# Patient Record
Sex: Male | Born: 2000 | Race: Black or African American | Hispanic: Yes | Marital: Single | Smoking: Never smoker
Health system: Southern US, Community
[De-identification: ages and names within clinical notes are randomized; demographics above are authoritative.]

## PROBLEM LIST (undated history)

## (undated) DIAGNOSIS — F32A Depression, unspecified: Secondary | ICD-10-CM

## (undated) DIAGNOSIS — R4701 Aphasia: Secondary | ICD-10-CM

## (undated) DIAGNOSIS — F840 Autistic disorder: Secondary | ICD-10-CM

## (undated) DIAGNOSIS — R569 Unspecified convulsions: Secondary | ICD-10-CM

## (undated) DIAGNOSIS — F419 Anxiety disorder, unspecified: Secondary | ICD-10-CM

## (undated) DIAGNOSIS — H6690 Otitis media, unspecified, unspecified ear: Secondary | ICD-10-CM

## (undated) DIAGNOSIS — F329 Major depressive disorder, single episode, unspecified: Secondary | ICD-10-CM

## (undated) HISTORY — DX: Depression, unspecified: F32.A

## (undated) HISTORY — DX: Aphasia: R47.01

## (undated) HISTORY — DX: Anxiety disorder, unspecified: F41.9

## (undated) HISTORY — DX: Unspecified convulsions: R56.9

## (undated) HISTORY — DX: Major depressive disorder, single episode, unspecified: F32.9

---
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
## 2018-01-04 ENCOUNTER — Other Ambulatory Visit: Payer: Self-pay

## 2018-01-04 ENCOUNTER — Emergency Department (HOSPITAL_COMMUNITY)
Admission: EM | Admit: 2018-01-04 | Discharge: 2018-01-05 | Disposition: A | Discharge status: Home / Self Care | Payer: Medicaid Other | Attending: Emergency Medicine | Admitting: Emergency Medicine

## 2018-01-04 DIAGNOSIS — Z79899 Other long term (current) drug therapy: Secondary | ICD-10-CM | POA: Diagnosis not present

## 2018-01-04 DIAGNOSIS — F329 Major depressive disorder, single episode, unspecified: Secondary | ICD-10-CM | POA: Diagnosis not present

## 2018-01-04 DIAGNOSIS — F840 Autistic disorder: Secondary | ICD-10-CM | POA: Insufficient documentation

## 2018-01-04 DIAGNOSIS — F6381 Intermittent explosive disorder: Secondary | ICD-10-CM | POA: Insufficient documentation

## 2018-01-04 DIAGNOSIS — F419 Anxiety disorder, unspecified: Secondary | ICD-10-CM | POA: Insufficient documentation

## 2018-01-04 DIAGNOSIS — R4689 Other symptoms and signs involving appearance and behavior: Secondary | ICD-10-CM | POA: Diagnosis present

## 2018-01-04 DIAGNOSIS — F72 Severe intellectual disabilities: Secondary | ICD-10-CM | POA: Insufficient documentation

## 2018-01-04 DIAGNOSIS — R456 Violent behavior: Secondary | ICD-10-CM | POA: Diagnosis present

## 2018-01-04 LAB — COMPREHENSIVE METABOLIC PANEL
ALBUMIN: 5 g/dL (ref 3.5–5.0)
ALT: 15 U/L (ref 0–44)
AST: 22 U/L (ref 15–41)
Alkaline Phosphatase: 94 U/L (ref 38–126)
Anion gap: 9 (ref 5–15)
BUN: 9 mg/dL (ref 6–20)
CO2: 30 mmol/L (ref 22–32)
Calcium: 10 mg/dL (ref 8.9–10.3)
Chloride: 101 mmol/L (ref 98–111)
Creatinine, Ser: 0.75 mg/dL (ref 0.61–1.24)
GFR calc Af Amer: >60 mL/min (ref >60)
GFR calc non Af Amer: >60 mL/min (ref >60)
Glucose, Bld: 89 mg/dL (ref 70–99)
Potassium: 3.8 mmol/L (ref 3.5–5.1)
Sodium: 140 mmol/L (ref 135–145)
Total Bilirubin: 0.6 mg/dL (ref 0.3–1.2)
Total Protein: 7.7 g/dL (ref 6.5–8.1)

## 2018-01-04 LAB — URINALYSIS, ROUTINE W REFLEX MICROSCOPIC
Bilirubin Urine: NEGATIVE
Glucose, UA: NEGATIVE mg/dL
Hgb urine dipstick: NEGATIVE
Ketones, ur: NEGATIVE mg/dL
Leukocytes, UA: NEGATIVE
Nitrite: NEGATIVE
Protein, ur: NEGATIVE mg/dL
Specific Gravity, Urine: 1.008 (ref 1.005–1.030)
pH: 6 (ref 5.0–8.0)

## 2018-01-04 LAB — CBC
HCT: 46.9 % (ref 39.0–52.0)
HEMOGLOBIN: 15.4 g/dL (ref 13.0–17.0)
MCH: 30.3 pg (ref 26.0–34.0)
MCHC: 32.8 g/dL (ref 30.0–36.0)
MCV: 92.1 fL (ref 80.0–100.0)
Platelets: 266 10*3/uL (ref 150–400)
RBC: 5.09 MIL/uL (ref 4.22–5.81)
RDW: 12.6 % (ref 11.5–15.5)
WBC: 11 10*3/uL — ABNORMAL HIGH (ref 4.0–10.5)
nRBC: 0 % (ref 0.0–0.2)

## 2018-01-04 LAB — RAPID URINE DRUG SCREEN, HOSP PERFORMED
AMPHETAMINES: NOT DETECTED
Barbiturates: NOT DETECTED
Benzodiazepines: NOT DETECTED
Cocaine: NOT DETECTED
Opiates: NOT DETECTED
Tetrahydrocannabinol: NOT DETECTED

## 2018-01-04 LAB — TSH: TSH: 1.628 u[IU]/mL (ref 0.350–4.500)

## 2018-01-04 MED ORDER — CLONIDINE HCL 0.1 MG PO TABS
0.2000 mg | ORAL_TABLET | Freq: Two times a day (BID) | ORAL | Status: DC
  Administered 2018-01-04 – 2018-01-05 (×2): 0.2 mg via ORAL
  Filled 2018-01-04 (×2): qty 2

## 2018-01-04 MED ORDER — CYPROHEPTADINE HCL 4 MG PO TABS
4.0000 mg | ORAL_TABLET | Freq: Two times a day (BID) | ORAL | Status: DC
  Administered 2018-01-04 – 2018-01-05 (×2): 4 mg via ORAL
  Filled 2018-01-04 (×3): qty 1

## 2018-01-04 MED ORDER — MELATONIN 5 MG PO TABS
10.0000 mg | ORAL_TABLET | Freq: Every day | ORAL | Status: DC
  Administered 2018-01-04: 10 mg via ORAL
  Filled 2018-01-04 (×2): qty 2

## 2018-01-04 MED ORDER — ARIPIPRAZOLE 5 MG PO TABS
5.0000 mg | ORAL_TABLET | ORAL | Status: DC
  Administered 2018-01-05: 5 mg via ORAL
  Filled 2018-01-04: qty 1

## 2018-01-04 MED ORDER — LAMOTRIGINE 25 MG PO TABS
25.0000 mg | ORAL_TABLET | Freq: Two times a day (BID) | ORAL | Status: DC
  Administered 2018-01-04 – 2018-01-05 (×2): 25 mg via ORAL
  Filled 2018-01-04 (×2): qty 1

## 2018-01-04 MED ORDER — MIRTAZAPINE 30 MG PO TABS
30.0000 mg | ORAL_TABLET | Freq: Every day | ORAL | Status: DC
  Administered 2018-01-04: 30 mg via ORAL
  Filled 2018-01-04: qty 1

## 2018-01-04 MED ORDER — ATOMOXETINE HCL 60 MG PO CAPS
60.0000 mg | ORAL_CAPSULE | Freq: Every day | ORAL | Status: DC
  Administered 2018-01-05: 60 mg via ORAL
  Filled 2018-01-04: qty 1

## 2018-01-04 MED ORDER — HYDROXYZINE HCL 25 MG PO TABS
25.0000 mg | ORAL_TABLET | ORAL | Status: DC
  Filled 2018-01-04: qty 1

## 2018-01-04 NOTE — Progress Notes (Signed)
Nurse, Reita ClicheBobby informed of pt disposition. Dr. Donnald GarrePfeiffer informed of pt disposition.
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
## 2018-01-04 NOTE — Progress Notes (Signed)
01/04/18 1820  Notified Dr Donnald GarrePfeiffer That mom was concerned that he has not had his medicines and his seizure medicine was due at 1800. Per MD she is on her way to see patient.
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
## 2018-01-05 DIAGNOSIS — F840 Autistic disorder: Secondary | ICD-10-CM

## 2018-01-05 NOTE — ED Notes (Signed)
RN gave patient his Abilify and pt put it in his mouth and pretended to swallow with water and then went to the bathroom and spit out the medication in the toilet.

## 2018-01-05 NOTE — Progress Notes (Addendum)
CSW aware patient has been psychiatrically and medically cleared for discharge. CSW aware patient is from home and has been having increased behaviors. CSW aware patient is active with [facility]. CSW attempted to reach out to patient's legal guardian/mother, Trilby DrummerJennifer Sievert [phone], regarding patient disposition. Phone went straight to voicemail, CSW left message for return call.  12:12pm- CSW received phone call from McCombJamie with [facility] [phone]. Per Asher MuirJamie, she is patient's Care Coordinator. Asher MuirJamie confirmed that because patient is not receiving any services/funding at this time that there are no placement options for patient. Per Asher MuirJamie, patient's mother is likely at work and unable to answer her phone. Per Asher MuirJamie, patient's mother gets off around 3:30 or 4 and would be able to discuss disposition.  1:36pm- CSW received return call from patient's mother/legal guardian Victorino DikeJennifer. Per Victorino DikeJennifer, she is currently at work. CSW updated Victorino DikeJennifer that patient was assessed and the psychiatry team has psychiatrically cleared patient for discharge. Victorino DikeJennifer expressed concern for patient's behaviors and how to manage them at home. CSW explained psychiatry team is advising that patient follow up with his outpatient provider for medication management. Victorino DikeJennifer expressed understanding. Per Victorino DikeJennifer, she would be by to pick up patient at 4pm. CSW has updated patient's RN.   Archie BalboaMackenzie Irwin, LCSWA  Clinical Social Work Department  [facility]  [phone]

## 2018-01-05 NOTE — BH Assessment (Addendum)
[facility]  Per Juanetta BeetsJacqueline Norman, DO, this pt does not require psychiatric hospitalization at this time.  Pt presents under IVC initiated by pt's mother/legal guardian, which Dr Sharma CovertNorman has rescinded.  Pt is psychiatrically cleared.  Discharge instructions advise pt to continue treatment with Thedore MinsMojeed Akintayo, MD at the [facility].  Archie BalboaMackenzie Irwin, CSW agrees to contact pt's guardian.  Pt's nurse, Waldo LaineKaitlin, has been notified.  Doylene Canninghomas Harvy Riera, MA Triage Specialist [phone]

## 2018-01-05 NOTE — ED Notes (Signed)
Patient attempting to pull away from mom and run out the door after RN finished going over discharge paperwork.  Mother spoke firmly to patient and he pulled her sharply.  Pt walked out with staff

## 2018-01-05 NOTE — ED Notes (Signed)
Patient continually pulling off clothing and ripping paper clothes. Pt currently shirtless. RN unable to make patient stop ripping his clothes

## 2018-01-05 NOTE — Consult Note (Addendum)
[name] Psych ED Discharge  01/05/2018 11:40 AM Nicholas Shepherd  MRN:  [id number] Principal Problem: Autism spectrum disorder Discharge Diagnoses:  Patient Active Problem List   Diagnosis Date Noted  . DMDD (disruptive mood dysregulation disorder) (HCC) [F34.81] 11/06/2017  . Autism spectrum disorder [F84.0] 11/06/2017  . Intellectual disability [F79] 11/06/2017  . Aggressive behavior [R46.89] 06/09/2017    Subjective:17 year old male who presented to the ED under IVC by mother for increased obsessive behaviors, aggression and violence towards staff and [facility]. Patient is nonverbal and has severe autism, unable to assess patient.  Mother no longer feels safe to take patient home, and is requesting assistance with placement. She reports patient is unable to identify any triggers at this time that could lead to his increase in aggressive behaviors. No attempts to hurt himself or others, not responding to internal stimuli, no substance abuse.  Stable to return home.  Total Time spent with patient: 20 minutes  Past Psychiatric History: autism, DMDD, ID, Autism Spectrum Disorder  Past Medical History:  Past Medical History:  Diagnosis Date  . Anxiety   . Autism disorder    severe per legal gaurdian  . Depression   . Nonverbal   . Otitis media   . Seizures (HCC) 11/09/2017   No past surgical history on file. Family History:  Family History  Problem Relation Age of Onset  . Seizures Mother        epilepsy as a child   Family Psychiatric  History: none per chart review.  Social History:  Social History   Substance and Sexual Activity  Alcohol Use No     Social History   Substance and Sexual Activity  Drug Use No    Social History   Socioeconomic History  . Marital status: Single    Spouse name: Not on file  . Number of children: 0  . Years of education: 7  . Highest education level: Not on file  Occupational History    Comment: NA  Social Needs  . Financial resource  strain: Not on file  . Food insecurity:    Worry: Not on file    Inability: Not on file  . Transportation needs:    Medical: Not on file    Non-medical: Not on file  Tobacco Use  . Smoking status: Never Smoker  . Smokeless tobacco: Never Used  Substance and Sexual Activity  . Alcohol use: No  . Drug use: No  . Sexual activity: Not on file  Lifestyle  . Physical activity:    Days per week: Not on file    Minutes per session: Not on file  . Stress: Not on file  Relationships  . Social connections:    Talks on phone: Not on file    Gets together: Not on file    Attends religious service: Not on file    Active member of club or organization: Not on file    Attends meetings of clubs or organizations: Not on file    Relationship status: Not on file  Other Topics Concern  . Not on file  Social History Narrative   Lives with mother, legal guardian   No caffeine    Has this patient used any form of tobacco in the last 30 days? (Cigarettes, Smokeless Tobacco, Cigars, and/or Pipes) He does not use tobacco products.   Current Medications: Current Facility-Administered Medications  Medication Dose Route Frequency Provider Last Rate Last Dose  . ARIPiprazole (ABILIFY) tablet 5 mg  5 mg Oral Ardelia Mems, MD   5 mg at 01/05/18 0735  . atomoxetine (STRATTERA) capsule 60 mg  60 mg Oral Daily Arby Barrette, MD   60 mg at 01/05/18 0943  . cloNIDine (CATAPRES) tablet 0.2 mg  0.2 mg Oral BID Arby Barrette, MD   0.2 mg at 01/05/18 0942  . cyproheptadine (PERIACTIN) 4 MG tablet 4 mg  4 mg Oral BID Arby Barrette, MD   4 mg at 01/05/18 0943  . hydrOXYzine (ATARAX/VISTARIL) tablet 25 mg  25 mg Oral See admin instructions Arby Barrette, MD      . lamoTRIgine (LAMICTAL) tablet 25 mg  25 mg Oral BID Arby Barrette, MD   25 mg at 01/05/18 0943  . Melatonin TABS 10 mg  10 mg Oral QHS Arby Barrette, MD   10 mg at 01/04/18 2134  . mirtazapine (REMERON) tablet 30 mg  30 mg Oral QHS  Arby Barrette, MD   30 mg at 01/04/18 2133   Current Outpatient Medications  Medication Sig Dispense Refill  . ARIPiprazole (ABILIFY) 5 MG tablet Take 5 mg by mouth every morning.     Marland Kitchen atomoxetine (STRATTERA) 60 MG capsule Take 60 mg by mouth daily.    . cloNIDine (CATAPRES) 0.2 MG tablet Take 0.2 mg by mouth 2 (two) times daily.   1  . cyproheptadine (PERIACTIN) 4 MG tablet Take 1 tablet (4 mg total) by mouth 2 (two) times daily. 60 tablet 0  . hydrOXYzine (ATARAX/VISTARIL) 25 MG tablet Take 25 mg by mouth See admin instructions. Take 25 mg by mouth three times a day- 6 AM, 11 AM, and 6 PM  1  . Iron-Vitamins (GERITOL PO) Take 1 tablet by mouth daily.    Marland Kitchen lamoTRIgine (LAMICTAL) 25 MG tablet Take 25mg  daily for 2 weeks; then take 25mg  BID for 2 weeks; then take 50mg  BID. (Patient taking differently: Take 25 mg by mouth 2 (two) times daily. ) 120 tablet 12  . MELATONIN PO Take 10 mg by mouth at bedtime.     . mirtazapine (REMERON) 30 MG tablet Take 1 tablet (30 mg total) by mouth at bedtime. 30 tablet 0   PTA Medications: (Not in a hospital admission)   Musculoskeletal: Strength & Muscle Tone: within normal limits Gait & Station: normal Patient leans: N/A  Psychiatric Specialty Exam: Physical Exam  Nursing note and vitals reviewed. Constitutional: He appears well-developed and well-nourished.  HENT:  Head: Normocephalic and atraumatic.  Neck: Normal range of motion.  Respiratory: Effort normal.  Musculoskeletal: Normal range of motion.  Neurological: He is alert.  Psychiatric: His behavior is normal. Thought content normal. His affect is blunt. Cognition and memory are impaired. He expresses impulsivity. He is noncommunicative.    Review of Systems  Unable to perform ROS: Patient nonverbal    Blood pressure (!) 130/98, pulse 97, temperature 97.6 F (36.4 C), temperature source Oral, resp. rate 16, SpO2 99 %.There is no height or weight on file to calculate BMI.  General  Appearance: Casual, topless, ripping paper scrubs  Eye Contact:  Good  Speech:  limited  Volume:  Patient is nonverbal.  Mood:  Euthymic  Affect:  Blunt  Thought Process:  Unable to assess, patient is nonverbal.   Orientation:  Other:  person  Thought Content:  unable to assess, limited communication abilities  Suicidal Thoughts: Unable to assess, patient is nonverbal.   Homicidal Thoughts:  Unable to assess, patient is nonverbal.   Memory:  unable to assess,  limited communication  Judgement:  Impaired  Insight:  Lacking  Psychomotor Activity:  Normal  Concentration: Unable to assess, patient is nonverbal.   Recall:  Unable to assess, patient is nonverbal.   Fund of Knowledge:  Unable to assess, patient is nonverbal.   Language:  Unable to assess, patient is nonverbal.   Akathisia:  No  Handed:  Right  AIMS (if indicated):   N/A  Assets:  Housing Leisure Time Physical Health Resilience Social Support  ADL's:  Impaired  Cognition:  Impaired,  Severe  Sleep:   N/A     Demographic Factors:  Male and Adolescent or young adult  Loss Factors: NA  Historical Factors: Impulsivity  Risk Reduction Factors:   Sense of responsibility to family, Living with another person, especially a relative, Positive social support and Positive therapeutic relationship  Continued Clinical Symptoms:  None  Cognitive Features That Contribute To Risk:  None    Suicide Risk:  Minimal: No identifiable suicidal ideation.  Patients presenting with no risk factors but with morbid ruminations; may be classified as minimal risk based on the severity of the depressive symptoms    Plan Of Care/Follow-up recommendations:  Activity:  as tolerated Diet:  heart healthy diet Other:  Patient to be psych cleared at this time. He continues to not meet criteria for inpatient admissions. Current behaviors are consistent with Autism Disorder. CSW is working closely with Care Coordinator to find funding and  placement for patient at this time.  Disposition: Discharge home Maryagnes Amosakia S Starkes-Perry, FNP 01/05/2018, 11:40 AM    Patient seen face-to-face for psychiatric evaluation, chart reviewed and case discussed with the physician extender and developed treatment plan. Reviewed the information documented and agree with the treatment plan.  Juanetta BeetsJacqueline Brannan Cassedy, DO 01/05/18 4:31 PM

## 2018-01-05 NOTE — Discharge Instructions (Signed)
For your behavioral health needs, you are advised to continue treatment with Mojeed Akintayo, MD, at the [facility]: ° °     [facility] °     [address] °     [place], [place] [address] °     [phone]

## 2018-01-05 NOTE — ED Notes (Signed)
Patient swallowed all 4 medications.

## 2018-01-05 NOTE — BHH Counselor (Signed)
12.18.19- Per Dr. Norman and Takia Starkes, PMHNP patient is psych cleared for discharge.  

## 2018-01-05 NOTE — ED Notes (Signed)
Patient attempting to run into nurses station. Security at bedside

## 2018-03-17 ENCOUNTER — Emergency Department (HOSPITAL_COMMUNITY)
Admission: EM | Admit: 2018-03-17 | Discharge: 2018-03-17 | Disposition: A | Discharge status: Home / Self Care | Payer: Medicaid Other | Attending: Emergency Medicine | Admitting: Emergency Medicine

## 2018-03-17 ENCOUNTER — Other Ambulatory Visit: Payer: Self-pay

## 2018-03-17 DIAGNOSIS — R569 Unspecified convulsions: Secondary | ICD-10-CM | POA: Diagnosis present

## 2018-03-17 DIAGNOSIS — Z79899 Other long term (current) drug therapy: Secondary | ICD-10-CM | POA: Diagnosis not present

## 2018-03-17 DIAGNOSIS — F840 Autistic disorder: Secondary | ICD-10-CM | POA: Diagnosis not present

## 2018-03-17 LAB — BASIC METABOLIC PANEL
Anion gap: 10 (ref 5–15)
BUN: 10 mg/dL (ref 6–20)
CALCIUM: 9.8 mg/dL (ref 8.9–10.3)
CO2: 26 mmol/L (ref 22–32)
CREATININE: 0.83 mg/dL (ref 0.61–1.24)
Chloride: 104 mmol/L (ref 98–111)
GFR calc Af Amer: >60 mL/min (ref >60)
GFR calc non Af Amer: >60 mL/min (ref >60)
Glucose, Bld: 75 mg/dL (ref 70–99)
Potassium: 4.1 mmol/L (ref 3.5–5.1)
Sodium: 140 mmol/L (ref 135–145)

## 2018-03-17 LAB — CBC WITH DIFFERENTIAL/PLATELET
Abs Immature Granulocytes: 0.23 10*3/uL — ABNORMAL HIGH (ref 0.00–0.07)
Basophils Absolute: 0 10*3/uL (ref 0.0–0.1)
Basophils Relative: 0 %
EOS ABS: 0.2 10*3/uL (ref 0.0–0.5)
Eosinophils Relative: 2 %
HCT: 45.8 % (ref 39.0–52.0)
Hemoglobin: 15.1 g/dL (ref 13.0–17.0)
Immature Granulocytes: 2 %
Lymphocytes Relative: 12 %
Lymphs Abs: 1.5 10*3/uL (ref 0.7–4.0)
MCH: 29.5 pg (ref 26.0–34.0)
MCHC: 33 g/dL (ref 30.0–36.0)
MCV: 89.5 fL (ref 80.0–100.0)
MONO ABS: 0.6 10*3/uL (ref 0.1–1.0)
Monocytes Relative: 5 %
Neutro Abs: 9.9 10*3/uL — ABNORMAL HIGH (ref 1.7–7.7)
Neutrophils Relative %: 79 %
Platelets: 284 10*3/uL (ref 150–400)
RBC: 5.12 MIL/uL (ref 4.22–5.81)
RDW: 12.4 % (ref 11.5–15.5)
WBC: 12.4 10*3/uL — ABNORMAL HIGH (ref 4.0–10.5)
nRBC: 0 % (ref 0.0–0.2)

## 2018-03-17 LAB — ETHANOL

## 2018-03-17 LAB — ACETAMINOPHEN LEVEL: Acetaminophen (Tylenol), Serum: <10 ug/mL — ABNORMAL LOW (ref 10–30)

## 2018-03-17 LAB — CBG MONITORING, ED: Glucose-Capillary: 92 mg/dL (ref 70–99)

## 2018-03-17 LAB — SALICYLATE LEVEL: Salicylate Lvl: <7 mg/dL (ref 2.8–30.0)

## 2018-03-17 NOTE — Discharge Instructions (Addendum)
Please follow up with neurology tomorrow as scheduled for further evaluation of your seizure.  Return to the ER if you develop another seizure episode or if you have any concerns.

## 2018-03-17 NOTE — ED Notes (Signed)
Family at bedside. 

## 2018-03-17 NOTE — ED Triage Notes (Signed)
Pt BIBA from school c/o witnessed seizure.  School staff report that pt was standing upright when he had a "full body seizure and they went to catch him before he fell and hit his head." Staff timed seizure - 4 min 22 sec.   Pt had incontinent episode, no trauma seen to tongue.   Pt was post-ictal when EMS arrived, able to respond to name.

## 2018-03-17 NOTE — ED Provider Notes (Signed)
[facility]-EMERGENCY DEPT Provider Note   CSN: [id number] Arrival date & time: 03/17/18  1356  History   Chief Complaint Chief Complaint  Patient presents with  . Seizures    HPI Nicholas Shepherd is a 18 y.o. male with past medical past medical history significant for anxiety, autism disorder, nonverbal at baseline seizure disorder, DMDD who presents for seizure.  Patient had a witnessed seizure at school.  Patient had generalized tonic-clonic seizure.  Staff was able to catch patient before he fell.  Staff timed seizure at 4 minutes 22 seconds.  Patient had episode of incontinence with seizure.  No tongue trauma.  Patient was postictal when EMS arrival, was able to respond to name.  Level 5 caveat, nonverbal at baseline and postictal.  Patient patient mother in room.  States patient has been acting his "normal self."  No recent fever, cough, headaches, abdominal pain, normal urination and bowel movements.  No recent trauma or injuries.     HPI  Past Medical History:  Diagnosis Date  . Anxiety   . Autism disorder    severe per legal gaurdian  . Depression   . Nonverbal   . Otitis media   . Seizures (HCC) 11/09/2017    Patient Active Problem List   Diagnosis Date Noted  . DMDD (disruptive mood dysregulation disorder) (HCC) 11/06/2017  . Autism spectrum disorder 11/06/2017  . Intellectual disability 11/06/2017  . Aggressive behavior 06/09/2017    No past surgical history on file.      Home Medications    Prior to Admission medications   Medication Sig Start Date End Date Taking? Authorizing Provider  ARIPiprazole (ABILIFY) 5 MG tablet Take 5 mg by mouth every morning.    Yes [provider]  atomoxetine (STRATTERA) 60 MG capsule Take 60 mg by mouth daily.   Yes [provider]  clomiPRAMINE (ANAFRANIL) 50 MG capsule Take 50 mg by mouth every 12 (twelve) hours. 02/28/18  Yes [provider]  cloNIDine (CATAPRES) 0.2 MG  tablet Take 0.2 mg by mouth 2 (two) times daily.  05/20/17  Yes [provider]  cyproheptadine (PERIACTIN) 4 MG tablet Take 1 tablet (4 mg total) by mouth 2 (two) times daily. 11/07/17  Yes Charm Rings, NP  hydrOXYzine (ATARAX/VISTARIL) 50 MG tablet Take 50 mg by mouth 3 (three) times daily. 02/28/18  Yes [provider]  Iron-Vitamins (GERITOL PO) Take 1 tablet by mouth daily.   Yes [provider]  lamoTRIgine (LAMICTAL) 25 MG tablet Take 25mg  daily for 2 weeks; then take 25mg  BID for 2 weeks; then take 50mg  BID. Patient taking differently: Take 25 mg by mouth 2 (two) times daily.  11/11/17  Yes Penumalli, Vikram R, MD  MELATONIN PO Take 10 mg by mouth at bedtime.    Yes [provider]  mirtazapine (REMERON) 30 MG tablet Take 1 tablet (30 mg total) by mouth at bedtime. Patient not taking: Reported on 03/17/2018 11/07/17   Charm Rings, NP    Family History Family History  Problem Relation Age of Onset  . Seizures Mother        epilepsy as a child    Social History Social History   Tobacco Use  . Smoking status: Never Smoker  . Smokeless tobacco: Never Used  Substance Use Topics  . Alcohol use: No  . Drug use: No     Allergies   Depakote [divalproex sodium]   Review of Systems Review of Systems  Unable to perform ROS: Patient nonverbal     Physical Exam Updated Vital Signs BP 99/61   Pulse 80   Temp 97.9 F (36.6 C) (Oral)   Resp 18   SpO2 100%   Physical Exam Vitals signs and nursing note reviewed.  Constitutional:      General: He is not in acute distress.    Appearance: He is well-developed. He is not ill-appearing, toxic-appearing or diaphoretic.     Comments: Somnolent, Postictal  HENT:     Head: Atraumatic.     Comments: Head atraumatic. No contusions, abrasions or hematomas.  No periorbital erythema. Eyes:     Pupils: Pupils are equal, round, and reactive to light.  Neck:     Musculoskeletal: Normal range of  motion and neck supple.     Comments: No neck stiffness or neck rigidity. No meningismus. Cardiovascular:     Rate and Rhythm: Normal rate and regular rhythm.     Pulses: Normal pulses.     Heart sounds: Normal heart sounds.  Pulmonary:     Effort: Pulmonary effort is normal. No respiratory distress.     Breath sounds: Normal breath sounds. No stridor. No wheezing, rhonchi or rales.  Abdominal:     General: There is no distension.     Palpations: Abdomen is soft.     Comments: Soft without rebound or guarding.  Musculoskeletal: Normal range of motion.     Comments: Moves all 4 extremities without difficulty. Able to follow small commands.  Skin:    General: Skin is warm and dry.     Comments: No rashes or lesions.  Neurological:     Mental Status: He is alert.     Comments: No facial asymmetry     ED Treatments / Results  Labs (all labs ordered are listed, but only abnormal results are displayed) Labs Reviewed  CBC WITH DIFFERENTIAL/PLATELET - Abnormal; Notable for the following components:      Result Value   WBC 12.4 (*)    All other components within normal limits  BASIC METABOLIC PANEL  SALICYLATE LEVEL  ACETAMINOPHEN LEVEL  ETHANOL  RAPID URINE DRUG SCREEN, HOSP PERFORMED  URINALYSIS, ROUTINE W REFLEX MICROSCOPIC  CBG MONITORING, ED    EKG None  Radiology No results found.  Procedures Procedures (including critical care time)  Medications Ordered in ED Medications - No data to display   Initial Impression / Assessment and Plan / ED Course  I have reviewed the triage vital signs and the nursing notes.  Pertinent labs & imaging results that were available during my care of the patient were reviewed by me and considered in my medical decision making (see chart for details).  18 year old who appears otherwise well presents for evaluation of seizure.  Afebrile, nonseptic, non-ill-appearing.  Patient postictal on arrival after 4-minute seizure.  Did not hit  head.  This is first seizure since starting on Lamictal.  Followed by [facility].  Started on Lamictal November 2019. Able to follow small commands on initial evaluation. Will obtain labs to look for secondary causes of seizure and reevaluate.  Do not think patient needs additional head CT at this time.  No recent illnesses per mother.  Labs and reassessment pending care transfer as patient is postictal.  Care transferred to [facility] [place].   Ultimate treatment, plan and disposition to be determined by Laveda Norman, PA-C.  Patient has been seen evaluated by attending Dr. Pilar Plate who agrees with the treatment, plan and disposition.  Final Clinical Impressions(s) / ED Diagnoses   Final diagnoses:  Seizure [facility])    ED Discharge Orders    None       Evelen Vazguez A, PA-C 03/17/18 1557    Sabas Sous, MD 03/17/18 [phone]

## 2018-03-17 NOTE — ED Notes (Signed)
Bed: WA20 Expected date:  Expected time:  Means of arrival:  Comments: EMS-SZ 

## 2018-03-17 NOTE — ED Provider Notes (Signed)
Received sign out at beginning of shift.  Recent hx of seizure, on Lamictal.  Had a seizure episode today, witnessed at school today. No injury.  No recent sickness.  Check labs and lamictal.  If back to baseline, can be discharge to f/u with neuro outpt.    5:17 PM Pt back to baseline, labs are reassuring.  He does have an appointment to be seen by his neurologist DR. Penumalli tomorrow.  If he develop another seizure episode then he should return for further management.  Mom at bedside agrees with plan.   BP 109/70   Pulse 88   Temp 97.9 F (36.6 C) (Oral)   Resp 17   SpO2 97%   Results for orders placed or performed during the hospital encounter of 03/17/18  Basic metabolic panel  Result Value Ref Range   Sodium 140 135 - 145 mmol/L   Potassium 4.1 3.5 - 5.1 mmol/L   Chloride 104 98 - 111 mmol/L   CO2 26 22 - 32 mmol/L   Glucose, Bld 75 70 - 99 mg/dL   BUN 10 6 - 20 mg/dL   Creatinine, Ser 2.50 0.61 - 1.24 mg/dL   Calcium 9.8 8.9 - 53.9 mg/dL   GFR calc non Af Amer >60 >60 mL/min   GFR calc Af Amer >60 >60 mL/min   Anion gap 10 5 - 15  CBC WITH DIFFERENTIAL  Result Value Ref Range   WBC 12.4 (H) 4.0 - 10.5 K/uL   RBC 5.12 4.22 - 5.81 MIL/uL   Hemoglobin 15.1 13.0 - 17.0 g/dL   HCT 76.7 34.1 - 93.7 %   MCV 89.5 80.0 - 100.0 fL   MCH 29.5 26.0 - 34.0 pg   MCHC 33.0 30.0 - 36.0 g/dL   RDW 90.2 40.9 - 73.5 %   Platelets 284 150 - 400 K/uL   nRBC 0.0 0.0 - 0.2 %   Neutrophils Relative % 79 %   Neutro Abs 9.9 (H) 1.7 - 7.7 K/uL   Lymphocytes Relative 12 %   Lymphs Abs 1.5 0.7 - 4.0 K/uL   Monocytes Relative 5 %   Monocytes Absolute 0.6 0.1 - 1.0 K/uL   Eosinophils Relative 2 %   Eosinophils Absolute 0.2 0.0 - 0.5 K/uL   Basophils Relative 0 %   Basophils Absolute 0.0 0.0 - 0.1 K/uL   WBC Morphology MORPHOLOGY UNREMARKABLE    Immature Granulocytes 2 %   Abs Immature Granulocytes 0.23 (H) 0.00 - 0.07 K/uL  Salicylate level  Result Value Ref Range   Salicylate Lvl  <7.0 2.8 - 30.0 mg/dL  Acetaminophen level  Result Value Ref Range   Acetaminophen (Tylenol), Serum <10 (L) 10 - 30 ug/mL  Ethanol/ETOH  Result Value Ref Range   Alcohol, Ethyl (B) <10 <10 mg/dL  CBG monitoring, ED  Result Value Ref Range   Glucose-Capillary 92 70 - 99 mg/dL   No results found.    Fayrene Helper, PA-C 03/17/18 1717    Derwood Kaplan, MD 03/17/18 [phone]

## 2018-03-18 ENCOUNTER — Other Ambulatory Visit: Payer: Self-pay

## 2018-03-18 ENCOUNTER — Ambulatory Visit (INDEPENDENT_AMBULATORY_CARE_PROVIDER_SITE_OTHER): Payer: Medicaid Other | Admitting: Family Medicine

## 2018-03-18 ENCOUNTER — Encounter: Payer: Self-pay | Admitting: Family Medicine

## 2018-03-18 VITALS — BP 126/74 | HR 80 | Resp 14 | Ht 68.5 in | Wt 126.5 lb

## 2018-03-18 DIAGNOSIS — R569 Unspecified convulsions: Secondary | ICD-10-CM | POA: Diagnosis not present

## 2018-03-18 MED ORDER — LAMOTRIGINE 25 MG PO TABS: ORAL_TABLET | ORAL | 12 refills | Status: DC

## 2018-03-18 NOTE — Patient Instructions (Addendum)
Increase lamotrigine to 50mg  twice daily  Follow up in 2 months (per mom's request)   Seizure, Adult When you have a seizure:  Parts of your body may move.  You may have a change in how aware or awake (conscious) you are.  You may shake (convulse). Seizures usually last from 30 seconds to 2 minutes. Usually, they are not harmful unless they last a long time. What are the signs or symptoms? Common symptoms of this condition include:  Shaking (convulsions).  Stiffness in the body.  Passing out (losing consciousness).  Uncontrolled movements in the: ? Arms or legs. ? Eyes. ? Head. ? Mouth. Some people have symptoms right before a seizure happens. These symptoms may include:  Fear.  Worry (anxiety).  Feeling like you are going to throw up (nausea).  Feeling like the room is spinning (vertigo).  Feeling like you saw or heard something before (dj vu).  Odd tastes or smells.  Changes in vision, such as seeing flashing lights or spots. Follow these instructions at home: Medicines   Take over-the-counter and prescription medicines only as told by your doctor.  Do not eat or drink anything that may keep your medicine from working, such as alcohol. Activity  Do not do any activities that would be dangerous if you had another seizure, like driving or swimming. Wait until your doctor says it is safe for you to do them.  If you live in the [place] ask your local DMV (department of motor vehicles) when you can drive.  Get plenty of rest. Teaching others   Teach friends and family what to do when you have a seizure. They should: ? Lay you on the ground. ? Protect your head and body. ? Loosen any tight clothing around your neck. ? Turn you on your side. ? Not hold you down. ? Not put anything into your mouth. ? Know whether or not you need emergency care. ? Stay with you until you are better. General instructions  Contact your doctor each time you have a  seizure.  Avoid anything that gives you seizures.  Keep a seizure diary. Write down: ? What you think caused each seizure. ? What you remember about each seizure.  Keep all follow-up visits as told by your doctor. This is important. Contact a doctor if:  You have another seizure.  You have seizures more often.  There is any change in what happens during your seizures.  You keep having seizures with treatment.  You have symptoms of being sick or having an infection. Get help right away if:  You have a seizure: ? That lasts longer than 5 minutes. ? That is different than seizures you had before. ? That makes it harder to breathe. ? After you hurt your head.  After a seizure, you cannot speak or use a part of your body.  After a seizure, you are confused or have a bad headache.  You have two or more seizures in a row.  You are having seizures more often.  You do not wake up right after a seizure.  You get hurt during a seizure. In an emergency:  These symptoms may be an emergency. Do not wait to see if the symptoms will go away. Get medical help right away. Call your local emergency services (911 in the [place] Do not drive yourself to the hospital. Summary  Seizures usually last from 30 seconds to 2 minutes. Usually, they are not harmful unless they last a long time.  Do not eat or drink anything that may keep your medicine from working, such as alcohol.  Teach friends and family what to do when you have a seizure.  Contact your doctor each time you have a seizure. This information is not intended to replace advice given to you by your health care provider. Make sure you discuss any questions you have with your health care provider. Document Released: 06/24/2007 Document Revised: 09/29/2017 Document Reviewed: 02/11/2017 Elsevier Interactive Patient Education  2019 [employer].    Lamotrigine tablets What is this medicine? LAMOTRIGINE (la MOE Patrecia Pace) is used  to control seizures in adults and children with epilepsy and Lennox-Gastaut syndrome. It is also used in adults to treat bipolar disorder. This medicine may be used for other purposes; ask your health care provider or pharmacist if you have questions. COMMON BRAND NAME(S): Lamictal, Subvenite What should I tell my health care provider before I take this medicine? They need to know if you have any of these conditions: -aseptic meningitis during prior use of lamotrigine -depression -folate deficiency -kidney disease -liver disease -suicidal thoughts, plans, or attempt; a previous suicide attempt by you or a family member -an unusual or allergic reaction to lamotrigine or other seizure medications, other medicines, foods, dyes, or preservatives -pregnant or trying to get pregnant -breast-feeding How should I use this medicine? Take this medicine by mouth with a glass of water. Follow the directions on the prescription label. Do not chew these tablets. If this medicine upsets your stomach, take it with food or milk. Take your doses at regular intervals. Do not take your medicine more often than directed. A special MedGuide will be given to you by the pharmacist with each new prescription and refill. Be sure to read this information carefully each time. Talk to your pediatrician regarding the use of this medicine in children. While this drug may be prescribed for children as young as 2 years for selected conditions, precautions do apply. Overdosage: If you think you have taken too much of this medicine contact a poison control center or emergency room at once. NOTE: This medicine is only for you. Do not share this medicine with others. What if I miss a dose? If you miss a dose, take it as soon as you can. If it is almost time for your next dose, take only that dose. Do not take double or extra doses. What may interact with this medicine? -atazanavir -carbamazepine -male hormones, including  contraceptive or birth control pills -lopinavir -methotrexate -phenobarbital -phenytoin -primidone -pyrimethamine -rifampin -ritonavir -trimethoprim -valproic acid This list may not describe all possible interactions. Give your health care provider a list of all the medicines, herbs, non-prescription drugs, or dietary supplements you use. Also tell them if you smoke, drink alcohol, or use illegal drugs. Some items may interact with your medicine. What should I watch for while using this medicine? Visit your doctor or health care professional for regular checks on your progress. If you take this medicine for seizures, wear a Medic Alert bracelet or necklace. Carry an identification card with information about your condition, medicines, and doctor or health care professional. It is important to take this medicine exactly as directed. When first starting treatment, your dose will need to be adjusted slowly. It may take weeks or months before your dose is stable. You should contact your doctor or health care professional if your seizures get worse or if you have any new types of seizures. Do not stop taking this medicine unless  instructed by your doctor or health care professional. Stopping your medicine suddenly can increase your seizures or their severity. Contact your doctor or health care professional right away if you develop a rash while taking this medicine. Rashes may be very severe and sometimes require treatment in the hospital. Deaths from rashes have occurred. Serious rashes occur more often in children than adults taking this medicine. It is more common for these serious rashes to occur during the first 2 months of treatment, but a rash can occur at any time. You may get drowsy, dizzy, or have blurred vision. Do not drive, use machinery, or do anything that needs mental alertness until you know how this medicine affects you. To reduce dizzy or fainting spells, do not sit or stand up quickly,  especially if you are an older patient. Alcohol can increase drowsiness and dizziness. Avoid alcoholic drinks. If you are taking this medicine for bipolar disorder, it is important to report any changes in your mood to your doctor or health care professional. If your condition gets worse, you get mentally depressed, feel very hyperactive or manic, have difficulty sleeping, or have thoughts of hurting yourself or committing suicide, you need to get help from your health care professional right away. If you are a caregiver for someone taking this medicine for bipolar disorder, you should also report these behavioral changes right away. The use of this medicine may increase the chance of suicidal thoughts or actions. Pay special attention to how you are responding while on this medicine. Your mouth may get dry. Chewing sugarless gum or sucking hard candy, and drinking plenty of water may help. Contact your doctor if the problem does not go away or is severe. Women who become pregnant while using this medicine may enroll in the [place] American Antiepileptic Drug Pregnancy Registry by calling [id number]. This registry collects information about the safety of antiepileptic drug use during pregnancy. This medicine may cause a decrease in folic acid. You should make sure that you get enough folic acid while you are taking this medicine. Discuss the foods you eat and the vitamins you take with your health care professional. What side effects may I notice from receiving this medicine? Side effects that you should report to your doctor or health care professional as soon as possible: -allergic reactions like skin rash, itching or hives, swelling of the face, lips, or tongue -changes in vision -depressed mood -elevated mood, decreased need for sleep, racing thoughts, impulsive behavior -fever with rash, swollen lymph nodes, or swelling of the face -loss of balance or coordination -mouth sores -redness,  blistering, peeling or loosening of the skin, including inside the mouth -right upper belly pain -seizures -severe muscle pain -signs and symptoms of aseptic meningitis such as stiff neck and sensitivity to light, headache, drowsiness, fever, nausea, vomiting, rash -signs of infection - fever or chills, cough, sore throat, pain or difficulty passing urine -suicidal thoughts or other mood changes -swollen lymph nodes -trouble walking -unusual bruising or bleeding -unusually weak or tired -yellowing of the eyes or skin Side effects that usually do not require medical attention (report to your doctor or health care professional if they continue or are bothersome): -diarrhea -dizziness -dry mouth -stuffy nose -tiredness -tremors -trouble sleeping This list may not describe all possible side effects. Call your doctor for medical advice about side effects. You may report side effects to FDA at 1-800-FDA-1088. Where should I keep my medicine? Keep out of reach of children. Store at room temperature  between 15 and 30 degrees C (59 and 86 degrees F). Throw away any unused medicine after the expiration date. NOTE: This sheet is a summary. It may not cover all possible information. If you have questions about this medicine, talk to your doctor, pharmacist, or health care provider.  2019 Elsevier/Gold Standard (2016-08-24 16:07:39)

## 2018-03-18 NOTE — Progress Notes (Signed)
I reviewed note and agree with plan.   Suanne Marker, MD 03/18/2018, 1:44 PM Certified in Neurology, Neurophysiology and Neuroimaging  [facility] [address] [place], [place] [address] [phone]

## 2018-03-18 NOTE — Progress Notes (Signed)
PATIENT: Nicholas Shepherd DOB: April 11, 2000  REASON FOR VISIT: follow up HISTORY FROM: patient  Chief Complaint  Patient presents with  . Seizures    Here with mother who sts. pt. is compliant with Lamictal, no missed doses. Had a sz. at school yesterday, seen in the ER, no changes to meds./fim     HISTORY OF PRESENT ILLNESS: Today 03/18/18 Nicholas Shepherd is a 18 y.o. male here today for follow up of seizures.  He has taken lamotrigine 25 mg twice daily since last being seen in October 2019.  Mom reports being unaware of suggested dose of 50 mg twice daily.  Walter has tolerated medication well with no obvious adverse effects.  Unfortunately he did have a seizure yesterday at school.  This was a witnessed tonic-clonic event.  Seizure lasted a little over 4 minutes.  He was incontinent during event.  No tongue trauma noted.  His teacher was able to catch him prior to his fall.  They do not think he hit his head.  Mom reports that he was quite confused for short period of time following the seizure.  He was evaluated in the ER and discharged with follow-up here.  Work-up was unremarkable.  White blood cell count 12.4.  Mom denies any concerns of sickness.  No fever or URI symptoms noted.  He has not missed any doses of medication.  This is the only seizure activity she has noted since his last visit.  He is nonverbal at baseline.  He is followed very closely by psychiatry for autism.  He does not drive.   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  For past few months patient has been having increasing intermittent behavioral outbursts, managed by psychiatry Dr. Jannifer Franklin.   Patient was at school when he had a seizure and fell out of the chair and hit his head, bit his tongue.  Seizure lasted approximately 4 minutes.  Patient was taken to the hospital for evaluation.  CT of the head was unremarkable.   Lab testing could not be done as patient was uncooperative.  Patient was discharged for follow-up in neurology clinic.  No similar events in the past.  No prior history of seizures.  No family history of seizure.  REVIEW OF SYSTEMS: Out of a complete 14 system review of symptoms, the patient complains only of the following symptoms, chills, seizure, behavioral problem, autism and all other reviewed systems are negative.  ALLERGIES: Allergies  Allergen Reactions  . Depakote [Divalproex Sodium] Other (See Comments)    Made patient angry and his behavioral issues worsened     HOME MEDICATIONS: Outpatient Medications Prior to Visit  Medication Sig Dispense Refill  . ARIPiprazole (ABILIFY) 5 MG tablet Take 5 mg by mouth every morning.     Marland Kitchen atomoxetine (STRATTERA) 60 MG capsule Take 60 mg by mouth daily.    . clomiPRAMINE (ANAFRANIL) 50 MG capsule Take 50 mg by mouth every 12 (twelve) hours.    . cloNIDine (CATAPRES) 0.2 MG tablet Take 0.2 mg by mouth 2 (two) times daily.   1  . cyproheptadine (PERIACTIN) 4 MG tablet Take 1 tablet (4 mg total) by mouth 2 (two) times daily. 60 tablet 0  . hydrOXYzine (ATARAX/VISTARIL) 50 MG tablet Take 50 mg by mouth 3 (three) times daily.    . Iron-Vitamins (GERITOL PO) Take 1 tablet by mouth daily.    Marland Kitchen MELATONIN PO  Take 10 mg by mouth at bedtime.     . lamoTRIgine (LAMICTAL) 25 MG tablet Take 25mg  daily for 2 weeks; then take 25mg  BID for 2 weeks; then take 50mg  BID. (Patient taking differently: Take 25 mg by mouth 2 (two) times daily. ) 120 tablet 12  . mirtazapine (REMERON) 30 MG tablet Take 1 tablet (30 mg total) by mouth at bedtime. (Patient not taking: Reported on 03/18/2018) 30 tablet 0   No facility-administered medications prior to visit.     PAST MEDICAL HISTORY: Past Medical History:  Diagnosis Date  . Anxiety   . Autism disorder    severe per legal gaurdian  . Depression   . Nonverbal   . Otitis media   . Seizures (HCC) 11/09/2017     PAST SURGICAL HISTORY: No past surgical history on file.  FAMILY HISTORY: Family History  Problem Relation Age of Onset  . Seizures Mother        epilepsy as a child    SOCIAL HISTORY: Social History   Socioeconomic History  . Marital status: Single    Spouse name: Not on file  . Number of children: 0  . Years of education: 71  . Highest education level: Not on file  Occupational History    Comment: NA  Social Needs  . Financial resource strain: Not on file  . Food insecurity:    Worry: Not on file    Inability: Not on file  . Transportation needs:    Medical: Not on file    Non-medical: Not on file  Tobacco Use  . Smoking status: Never Smoker  . Smokeless tobacco: Never Used  Substance and Sexual Activity  . Alcohol use: No  . Drug use: No  . Sexual activity: Not on file  Lifestyle  . Physical activity:    Days per week: Not on file    Minutes per session: Not on file  . Stress: Not on file  Relationships  . Social connections:    Talks on phone: Not on file    Gets together: Not on file    Attends religious service: Not on file    Active member of club or organization: Not on file    Attends meetings of clubs or organizations: Not on file    Relationship status: Not on file  . Intimate partner violence:    Fear of current or ex partner: Not on file    Emotionally abused: Not on file    Physically abused: Not on file    Forced sexual activity: Not on file  Other Topics Concern  . Not on file  Social History Narrative   Lives with mother, legal guardian   No caffeine      PHYSICAL EXAM  Vitals:   03/18/18 0815  BP: 126/74  Pulse: 80  Resp: 14  Weight: 126 lb 8 oz (57.4 kg)  Height: 5' 8.5" (1.74 m)   Body mass index is 18.95 kg/m.  Generalized: Well developed, in no acute distress  Cardiology: normal rate and rhythm, no murmur noted Neurological examination  Mentation: Alert and smiling. Follows intermittent commands. Mute.   Cranial nerve II-XII: Pupils were equal round reactive to light. Extraocular movements were full, visual field were full on confrontational test. Facial sensation and strength were normal. Uvula tongue midline.  Motor: The motor testing reveals symmetrical movement bilaterally, Good symmetric motor tone is noted throughout.  Sensory: Sensory testing is intact to soft touch on all 4 extremities. No evidence  of extinction is noted.  Coordination: unable to assess, patient not following commands  Gait and station: Gait is normal.    DIAGNOSTIC DATA (LABS, IMAGING, TESTING) - I reviewed patient records, labs, notes, testing and imaging myself where available.  No flowsheet data found.   Lab Results  Component Value Date   WBC 12.4 (H) 03/17/2018   HGB 15.1 03/17/2018   HCT 45.8 03/17/2018   MCV 89.5 03/17/2018   PLT 284 03/17/2018      Component Value Date/Time   NA 140 03/17/2018 1457   K 4.1 03/17/2018 1457   CL 104 03/17/2018 1457   CO2 26 03/17/2018 1457   GLUCOSE 75 03/17/2018 1457   BUN 10 03/17/2018 1457   CREATININE 0.83 03/17/2018 1457   CALCIUM 9.8 03/17/2018 1457   PROT 7.7 01/04/2018 1925   ALBUMIN 5.0 01/04/2018 1925   AST 22 01/04/2018 1925   ALT 15 01/04/2018 1925   ALKPHOS 94 01/04/2018 1925   BILITOT 0.6 01/04/2018 1925   GFRNONAA >60 03/17/2018 1457   GFRAA >60 03/17/2018 1457   No results found for: CHOL, HDL, LDLCALC, LDLDIRECT, TRIG, CHOLHDL No results found for: RCVE9F No results found for: VITAMINB12 Lab Results  Component Value Date   TSH 1.628 01/04/2018     ASSESSMENT AND PLAN 18 y.o. year old male  has a past medical history of Anxiety, Autism disorder, Depression, Nonverbal, Otitis media, and Seizures (HCC) (11/09/2017). here with     ICD-10-CM   1. New onset seizure (HCC) R56.9     I have educated patient's mother regarding correct dosage of lamotrigine.  I will re-prescribe this medication with instructions that state 50 mg twice  daily.  She understands that this is 2 tablets twice daily.  Additional information provided in AVS.  He appears to be tolerating medication well.  I have discussed the potential to increase medication in the future if needed.  Mom denies any changes in behavior.  He is followed closely by psychiatry.  Work-up was unremarkable in the ER yesterday.  White blood cell count 12.4.  Mom was advised to monitor for any concerns of sickness or infection.  She should contact PCP immediately for any concerns of sickness.  Mom appears very anxious today regarding his diagnosis of seizure.  She is requesting to keep her appointment with Dr. Marjory Lies on April 24.  This request was honored.  She was also encouraged to reach out anytime she has questions.  She was provided contact information as well as advised to use MyChart with any concerns.  She verbalizes understanding.   No orders of the defined types were placed in this encounter.    Meds ordered this encounter  Medications  . lamoTRIgine (LAMICTAL) 25 MG tablet    Sig: Take 2 tablets (50mg ) twice daily    Dispense:  120 tablet    Refill:  12    Order Specific Question:   Supervising Provider    Answer:   Anson Fret [[id number]]      I spent 15 minutes with the patient. 50% of this time was spent counseling and educating patient on plan of care and medications.    Shawnie Dapper, FNP-C 03/18/2018, 9:42 AM [facility] [address] [place], [place] [address] [phone]

## 2018-04-08 ENCOUNTER — Emergency Department (HOSPITAL_COMMUNITY)
Admission: EM | Admit: 2018-04-08 | Discharge: 2018-04-08 | Disposition: A | Discharge status: Home / Self Care | Payer: Medicaid Other | Attending: Emergency Medicine | Admitting: Emergency Medicine

## 2018-04-08 ENCOUNTER — Other Ambulatory Visit: Payer: Self-pay

## 2018-04-08 ENCOUNTER — Encounter (HOSPITAL_COMMUNITY): Payer: Self-pay

## 2018-04-08 DIAGNOSIS — F840 Autistic disorder: Secondary | ICD-10-CM | POA: Insufficient documentation

## 2018-04-08 DIAGNOSIS — R569 Unspecified convulsions: Secondary | ICD-10-CM | POA: Insufficient documentation

## 2018-04-08 DIAGNOSIS — Z79899 Other long term (current) drug therapy: Secondary | ICD-10-CM | POA: Insufficient documentation

## 2018-04-08 LAB — CBC WITH DIFFERENTIAL/PLATELET
Abs Immature Granulocytes: 0.2 10*3/uL — ABNORMAL HIGH (ref 0.00–0.07)
Basophils Absolute: 0.1 10*3/uL (ref 0.0–0.1)
Basophils Relative: 1 %
Eosinophils Absolute: 0 10*3/uL (ref 0.0–0.5)
Eosinophils Relative: 0 %
HCT: 47 % (ref 39.0–52.0)
Hemoglobin: 15.6 g/dL (ref 13.0–17.0)
Immature Granulocytes: 2 %
Lymphocytes Relative: 16 %
Lymphs Abs: 1.5 10*3/uL (ref 0.7–4.0)
MCH: 29.7 pg (ref 26.0–34.0)
MCHC: 33.2 g/dL (ref 30.0–36.0)
MCV: 89.4 fL (ref 80.0–100.0)
Monocytes Absolute: 0.7 10*3/uL (ref 0.1–1.0)
Monocytes Relative: 8 %
Neutro Abs: 7.1 10*3/uL (ref 1.7–7.7)
Neutrophils Relative %: 73 %
PLATELETS: 322 10*3/uL (ref 150–400)
RBC: 5.26 MIL/uL (ref 4.22–5.81)
RDW: 12.7 % (ref 11.5–15.5)
WBC: 9.7 10*3/uL (ref 4.0–10.5)
nRBC: 0 % (ref 0.0–0.2)

## 2018-04-08 LAB — COMPREHENSIVE METABOLIC PANEL
ALT: 47 U/L — AB (ref 0–44)
AST: 149 U/L — ABNORMAL HIGH (ref 15–41)
Albumin: 4.8 g/dL (ref 3.5–5.0)
Alkaline Phosphatase: 98 U/L (ref 38–126)
Anion gap: 10 (ref 5–15)
BUN: 14 mg/dL (ref 6–20)
CO2: 25 mmol/L (ref 22–32)
Calcium: 10 mg/dL (ref 8.9–10.3)
Chloride: 102 mmol/L (ref 98–111)
Creatinine, Ser: 0.92 mg/dL (ref 0.61–1.24)
GFR calc non Af Amer: >60 mL/min (ref >60)
Glucose, Bld: 61 mg/dL — ABNORMAL LOW (ref 70–99)
Potassium: 4.2 mmol/L (ref 3.5–5.1)
Sodium: 137 mmol/L (ref 135–145)
Total Bilirubin: 0.5 mg/dL (ref 0.3–1.2)
Total Protein: 7.8 g/dL (ref 6.5–8.1)

## 2018-04-08 NOTE — ED Provider Notes (Signed)
[facility]-EMERGENCY DEPT Provider Note   CSN: [id number] Arrival date & time: 04/08/18  1148    History   Chief Complaint Chief Complaint  Patient presents with  . Seizures    HPI Nicholas Shepherd is a 18 y.o. male.     Patient had a seizure today.  1 month ago he had a seizure and his Lamictal was increased.  His mother states that the seizure lasted about 4 to 5 minutes.   Patient has a autism  The history is provided by a relative. No language interpreter was used.  Seizures  Seizure activity on arrival: yes   Seizure type:  Grand mal Preceding symptoms: no sensation of an aura present   Initial focality:  Diffuse Episode characteristics: no apnea   Postictal symptoms: somnolence   Return to baseline: yes   Severity:  Moderate Duration:  5 minutes Timing:  Once Progression:  Resolved Context: not alcohol withdrawal   Recent head injury:  No recent head injuries PTA treatment:  None History of seizures: yes     Past Medical History:  Diagnosis Date  . Anxiety   . Autism disorder    severe per legal gaurdian  . Depression   . Nonverbal   . Otitis media   . Seizures (HCC) 11/09/2017    Patient Active Problem List   Diagnosis Date Noted  . DMDD (disruptive mood dysregulation disorder) (HCC) 11/06/2017  . Autism spectrum disorder 11/06/2017  . Intellectual disability 11/06/2017  . Aggressive behavior 06/09/2017    History reviewed. No pertinent surgical history.      Home Medications    Prior to Admission medications   Medication Sig Start Date End Date Taking? Authorizing Provider  ARIPiprazole (ABILIFY) 5 MG tablet Take 5 mg by mouth every morning.     [provider]  atomoxetine (STRATTERA) 60 MG capsule Take 60 mg by mouth daily.    [provider]  clomiPRAMINE (ANAFRANIL) 50 MG capsule Take 50 mg by mouth every 12 (twelve) hours. 02/28/18   [provider]  cloNIDine (CATAPRES) 0.2 MG tablet  Take 0.2 mg by mouth 2 (two) times daily.  05/20/17   [provider]  cyproheptadine (PERIACTIN) 4 MG tablet Take 1 tablet (4 mg total) by mouth 2 (two) times daily. 11/07/17   Charm Rings, NP  hydrOXYzine (ATARAX/VISTARIL) 50 MG tablet Take 50 mg by mouth 3 (three) times daily. 02/28/18   [provider]  Iron-Vitamins (GERITOL PO) Take 1 tablet by mouth daily.    [provider]  lamoTRIgine (LAMICTAL) 25 MG tablet Take 2 tablets (50mg ) twice daily 03/18/18   Lomax, Amy, NP  MELATONIN PO Take 10 mg by mouth at bedtime.     [provider]    Family History Family History  Problem Relation Age of Onset  . Seizures Mother        epilepsy as a child    Social History Social History   Tobacco Use  . Smoking status: Never Smoker  . Smokeless tobacco: Never Used  Substance Use Topics  . Alcohol use: No  . Drug use: No     Allergies   Depakote [divalproex sodium]   Review of Systems Review of Systems  Unable to perform ROS: Mental status change  Neurological: Positive for seizures.     Physical Exam Updated Vital Signs BP 104/63   Pulse 75   Resp 15   Ht 5\' 9"  (1.753 m)   Wt  59 kg   SpO2 99%   BMI 19.20 kg/m   Physical Exam Constitutional:      Appearance: He is well-developed.  HENT:     Head: Normocephalic.  Eyes:     General: No scleral icterus.    Conjunctiva/sclera: Conjunctivae normal.  Neck:     Musculoskeletal: Neck supple.     Thyroid: No thyromegaly.  Cardiovascular:     Rate and Rhythm: Normal rate and regular rhythm.     Heart sounds: No murmur. No friction rub. No gallop.   Pulmonary:     Breath sounds: No stridor. No wheezing or rales.  Chest:     Chest wall: No tenderness.  Abdominal:     General: There is no distension.     Tenderness: There is no abdominal tenderness. There is no rebound.  Musculoskeletal: Normal range of motion.  Lymphadenopathy:     Cervical: No cervical adenopathy.  Skin:     Findings: No erythema or rash.  Neurological:     Motor: No abnormal muscle tone.     Coordination: Coordination normal.     Comments: Patient alert moves all extremities.  He has autism and is not able to communicate verbally  Psychiatric:     Comments: He is back to his normal behavior      ED Treatments / Results  Labs (all labs ordered are listed, but only abnormal results are displayed) Labs Reviewed  CBC WITH DIFFERENTIAL/PLATELET - Abnormal; Notable for the following components:      Result Value   Abs Immature Granulocytes 0.20 (*)    All other components within normal limits  COMPREHENSIVE METABOLIC PANEL - Abnormal; Notable for the following components:   Glucose, Bld 61 (*)    AST 149 (*)    ALT 47 (*)    All other components within normal limits    EKG None  Radiology No results found.  Procedures Procedures (including critical care time)  Medications Ordered in ED Medications - No data to display   Initial Impression / Assessment and Plan / ED Course  I have reviewed the triage vital signs and the nursing notes.  Pertinent labs & imaging results that were available during my care of the patient were reviewed by me and considered in my medical decision making (see chart for details).   Patient with a seizure.  Labs unremarkable.  His mother prefers to go ahead and take him home and have him follow-up next week with neurology.  That seems reasonable to me.  I have not increased his medicines and she will return if any problems      Final Clinical Impressions(s) / ED Diagnoses   Final diagnoses:  Seizure [facility])    ED Discharge Orders    None       Bethann Berkshire, MD 04/08/18 1332

## 2018-04-08 NOTE — ED Notes (Signed)
Patients mom was given discharge teaching and verbalized understanding. Patient ambulated out of ED w/ a steady gate.

## 2018-04-08 NOTE — ED Triage Notes (Addendum)
Pt brought in by mother from group home. Pt had a 5 minute seizure per staff. Pt's seizure meds were recently increased without a decrease in seizure activity.

## 2018-04-08 NOTE — Discharge Instructions (Addendum)
Call your neurologist and set up an appointment for follow-up next week.  Let them know that Nicholas Shepherd had a seizure today.  Continue with the present seizure medicines

## 2018-04-11 ENCOUNTER — Telehealth: Payer: Self-pay | Admitting: Family Medicine

## 2018-04-11 NOTE — Telephone Encounter (Signed)
Pt mother(on DPR) has called to inform that on Friday pt had a seizure to last 5 mins.  Mother states afterwards pt indicated that his head was hurting.  Mother took pt to ED but was told to follow up with Neurologist.  Mother is asking for a call to discuss seizure activity worsening.

## 2018-04-11 NOTE — Telephone Encounter (Signed)
pls offer tele-visit. -VRP

## 2018-04-11 NOTE — Telephone Encounter (Signed)
Spoke with the patient's mother(DPR verified). She mentioned that she was on her way to pick up her son Friday at the group home when he had a a seizure. She stated that she took him to [facility] ED. He is currently taking Lamictal 50 mg by mouth 2 times day, she doesn't think the medication is working. During the seizure she stated that he was foaming at the mouth and the he urinated and defecated on himself. Seizures are becoming worse and more frequent. Patient has complained of his head hurting afterwards. Please advise.

## 2018-04-12 NOTE — Telephone Encounter (Signed)
Spoke with patient's mother and advised her Dr Marjory Lies wants to schedule a telephone visit to discuss patient's recent seizure activity. Informed her we are severely limiting in office visits now due to COVID 19. She asked to be called Fri, her day off. Scheduled her for virtual visit this Friday. She verbalized understanding, appreciation.

## 2018-04-15 ENCOUNTER — Other Ambulatory Visit: Payer: Self-pay

## 2018-04-15 ENCOUNTER — Ambulatory Visit (INDEPENDENT_AMBULATORY_CARE_PROVIDER_SITE_OTHER): Payer: Medicaid Other | Admitting: Diagnostic Neuroimaging

## 2018-04-15 DIAGNOSIS — F840 Autistic disorder: Secondary | ICD-10-CM

## 2018-04-15 DIAGNOSIS — G40909 Epilepsy, unspecified, not intractable, without status epilepticus: Secondary | ICD-10-CM | POA: Diagnosis not present

## 2018-04-15 MED ORDER — LAMOTRIGINE 100 MG PO TABS: 100.0000 mg | ORAL_TABLET | Freq: Two times a day (BID) | ORAL | 12 refills | Status: DC

## 2018-04-15 MED ORDER — LAMOTRIGINE 25 MG PO TABS: ORAL_TABLET | ORAL | 0 refills | Status: DC

## 2018-04-15 NOTE — Progress Notes (Signed)
     Virtual Visit via Telephone Note  I connected with [name] (mother and guardian of patient Nicholas Shepherd) on 04/15/18 at 9:00 EST by telephone and verified that I am speaking with the correct person using two identifiers.   I discussed the limitations, risks, security and privacy concerns of performing an evaluation and management service by telephone and the availability of in person appointments. I also discussed with the patient that there may be a patient responsible charge related to this service. The patient expressed understanding and agreed to proceed.   History of Present Illness:  Patient had another breakthrough seizure on 04/08/18; 5 minute convulsions, incontinence, post-ictal confusion and combativeness. Went to ER. Eval'd and sent home. Has tolerated lamotrigine 50mg  twice a day x 1 month. No side effects. This is 3rd seizure in life (Oct 2019, Feb 2020, March 2020).     Observations/Objective:  - no pertinent info; patient is non-verbal; autistic; information via mother   Assessment and Plan:  SEIZURE DISORDER (autism, pervasive developmental delay) - increase lamotrigine to 75mg  twice a day x 2 week, then 100mg  twice a day   Meds ordered this encounter  Medications  . lamoTRIgine (LAMICTAL) 25 MG tablet    Sig: Take 3 tablets (75 mg total) by mouth 2 (two) times daily for 14 days, THEN 4 tablets (100 mg total) 2 (two) times daily for 14 days.    Dispense:  196 tablet    Refill:  0  . lamoTRIgine (LAMICTAL) 100 MG tablet    Sig: Take 1 tablet (100 mg total) by mouth 2 (two) times daily.    Dispense:  60 tablet    Refill:  12    Follow Up Instructions:  Return in about 3 months (around 07/16/2018).    I discussed the assessment and treatment plan with the patient. The patient was provided an opportunity to ask questions and all were answered. The patient agreed with the plan and demonstrated an understanding of the instructions.   The patient  was advised to call back or seek an in-person evaluation if the symptoms worsen or if the condition fails to improve as anticipated.  I provided 14 minutes of non-face-to-face time during this encounter.  Suanne Marker, MD 04/15/2018, 9:30 AM Certified in Neurology, Neurophysiology and Neuroimaging  [facility] [address], [address] [place], [place] [address] [phone]

## 2018-04-19 ENCOUNTER — Telehealth: Payer: Self-pay | Admitting: *Deleted

## 2018-04-19 NOTE — Telephone Encounter (Signed)
Return in about 3 months (around 07/16/2018).

## 2018-04-20 ENCOUNTER — Encounter (HOSPITAL_COMMUNITY): Payer: Self-pay | Admitting: Emergency Medicine

## 2018-04-20 ENCOUNTER — Emergency Department (HOSPITAL_COMMUNITY)
Admission: EM | Admit: 2018-04-20 | Discharge: 2018-04-20 | Disposition: A | Discharge status: Home / Self Care | Payer: Medicaid Other | Attending: Emergency Medicine | Admitting: Emergency Medicine

## 2018-04-20 ENCOUNTER — Other Ambulatory Visit: Payer: Self-pay

## 2018-04-20 DIAGNOSIS — Z046 Encounter for general psychiatric examination, requested by authority: Secondary | ICD-10-CM | POA: Diagnosis present

## 2018-04-20 DIAGNOSIS — Z79899 Other long term (current) drug therapy: Secondary | ICD-10-CM | POA: Insufficient documentation

## 2018-04-20 DIAGNOSIS — F840 Autistic disorder: Secondary | ICD-10-CM | POA: Diagnosis not present

## 2018-04-20 DIAGNOSIS — F3481 Disruptive mood dysregulation disorder: Secondary | ICD-10-CM | POA: Insufficient documentation

## 2018-04-20 DIAGNOSIS — R4689 Other symptoms and signs involving appearance and behavior: Secondary | ICD-10-CM | POA: Diagnosis not present

## 2018-04-20 DIAGNOSIS — F78 Other intellectual disabilities: Secondary | ICD-10-CM | POA: Insufficient documentation

## 2018-04-20 MED ORDER — HYDROXYZINE HCL 25 MG PO TABS
50.0000 mg | ORAL_TABLET | Freq: Once | ORAL | Status: AC
  Administered 2018-04-20: 22:00:00 50 mg via ORAL
  Filled 2018-04-20: qty 2

## 2018-04-20 MED ORDER — LAMOTRIGINE 100 MG PO TABS
100.0000 mg | ORAL_TABLET | Freq: Two times a day (BID) | ORAL | Status: DC
  Administered 2018-04-20: 100 mg via ORAL
  Filled 2018-04-20: qty 1

## 2018-04-20 MED ORDER — ARIPIPRAZOLE 5 MG PO TABS
7.5000 mg | ORAL_TABLET | Freq: Two times a day (BID) | ORAL | Status: DC
  Administered 2018-04-20: 7.5 mg via ORAL
  Filled 2018-04-20: qty 2

## 2018-04-20 MED ORDER — CLONIDINE HCL 0.1 MG PO TABS
0.2000 mg | ORAL_TABLET | Freq: Two times a day (BID) | ORAL | Status: DC
  Administered 2018-04-20: 0.2 mg via ORAL
  Filled 2018-04-20: qty 2

## 2018-04-20 NOTE — ED Notes (Signed)
Dr. Rhunette Croft spoke with Mother, Stevphen Meuse at the Group Home and Group Home owner, Nicole-not answering phone. Stevphen Meuse at the group home states he is too tired to pick up patient, Mother stated for Group home to pick up patient and when they would not, repeat calls to Mother, she did not pick up. [employer] remain at bedside. Dr. Rhunette Croft spoke with [name] from Group Home after message left that APS would be called and explained patient does not meet admission criteria. [employer] will transport patient back to group home and their Corporal is also going to group home to speak with staff. Patient remains calm and cooperative and has taken his night time medications.

## 2018-04-20 NOTE — ED Triage Notes (Signed)
Patient brought in by [employer] with IVC from the group home. Patient is autistic and was physically aggressive with staff.
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
## 2018-04-20 NOTE — Discharge Instructions (Addendum)
Although Nicholas Shepherd had aggressive behavior earlier today, he has behaved well in the emergency room.  We suspect that there could be a trigger that might have caused him to behave aggressively.  He does not have any psychiatry emergency for [place] to assess or treat.  If his outburst are regular in nature then we need him to be seen by an outpatient psychiatrist for optimization of his medications.

## 2018-04-20 NOTE — ED Notes (Signed)
Pt is sitting at the edge of the bed quietly. Pt does not appear to be in any pain, pt was compliant with medication administration.

## 2018-04-20 NOTE — Telephone Encounter (Signed)
04/19/18 LVM to schedule 3 month f/u

## 2018-04-23 ENCOUNTER — Encounter (HOSPITAL_COMMUNITY): Payer: Self-pay

## 2018-04-23 ENCOUNTER — Emergency Department (HOSPITAL_COMMUNITY)
Admission: EM | Admit: 2018-04-23 | Discharge: 2018-04-23 | Disposition: A | Discharge status: Home / Self Care | Payer: Medicaid Other | Attending: Emergency Medicine | Admitting: Emergency Medicine

## 2018-04-23 ENCOUNTER — Other Ambulatory Visit: Payer: Self-pay

## 2018-04-23 ENCOUNTER — Emergency Department (HOSPITAL_COMMUNITY): Payer: Medicaid Other

## 2018-04-23 DIAGNOSIS — Y999 Unspecified external cause status: Secondary | ICD-10-CM | POA: Diagnosis not present

## 2018-04-23 DIAGNOSIS — Y92192 Bathroom in other specified residential institution as the place of occurrence of the external cause: Secondary | ICD-10-CM | POA: Diagnosis not present

## 2018-04-23 DIAGNOSIS — F3481 Disruptive mood dysregulation disorder: Secondary | ICD-10-CM | POA: Insufficient documentation

## 2018-04-23 DIAGNOSIS — Y9389 Activity, other specified: Secondary | ICD-10-CM | POA: Diagnosis not present

## 2018-04-23 DIAGNOSIS — W25XXXA Contact with sharp glass, initial encounter: Secondary | ICD-10-CM | POA: Insufficient documentation

## 2018-04-23 DIAGNOSIS — F840 Autistic disorder: Secondary | ICD-10-CM | POA: Insufficient documentation

## 2018-04-23 DIAGNOSIS — Z79899 Other long term (current) drug therapy: Secondary | ICD-10-CM | POA: Insufficient documentation

## 2018-04-23 DIAGNOSIS — S61411A Laceration without foreign body of right hand, initial encounter: Secondary | ICD-10-CM | POA: Diagnosis not present

## 2018-04-23 DIAGNOSIS — Z593 Problems related to living in residential institution: Secondary | ICD-10-CM | POA: Diagnosis not present

## 2018-04-23 MED ORDER — ATOMOXETINE HCL 60 MG PO CAPS: 60.0000 mg | ORAL_CAPSULE | Freq: Every day | ORAL | Status: DC

## 2018-04-23 MED ORDER — LIDOCAINE-EPINEPHRINE-TETRACAINE (LET) SOLUTION
3.0000 mL | Freq: Once | NASAL | Status: AC
  Administered 2018-04-23: 3 mL via TOPICAL
  Filled 2018-04-23: qty 3

## 2018-04-23 MED ORDER — CYPROHEPTADINE HCL 4 MG PO TABS: 4.0000 mg | ORAL_TABLET | Freq: Two times a day (BID) | ORAL | Status: DC

## 2018-04-23 MED ORDER — LAMOTRIGINE 25 MG PO TABS: 75.0000 mg | ORAL_TABLET | Freq: Two times a day (BID) | ORAL | Status: DC

## 2018-04-23 MED ORDER — TETANUS-DIPHTH-ACELL PERTUSSIS 5-2.5-18.5 LF-MCG/0.5 IM SUSP
0.5000 mL | Freq: Once | INTRAMUSCULAR | Status: DC
  Filled 2018-04-23: qty 0.5

## 2018-04-23 MED ORDER — LORAZEPAM 0.5 MG PO TABS: 0.5000 mg | ORAL_TABLET | Freq: Three times a day (TID) | ORAL | 0 refills | Status: DC

## 2018-04-23 MED ORDER — LIDOCAINE HCL 1 % IJ SOLN: 20.0000 mL | Freq: Once | INTRAMUSCULAR | Status: DC

## 2018-04-23 MED ORDER — LORAZEPAM 0.5 MG PO TABS: 0.5000 mg | ORAL_TABLET | Freq: Three times a day (TID) | ORAL | Status: DC

## 2018-04-23 MED ORDER — CLONIDINE HCL 0.1 MG PO TABS: 0.2000 mg | ORAL_TABLET | Freq: Two times a day (BID) | ORAL | Status: DC

## 2018-04-23 MED ORDER — ARIPIPRAZOLE 5 MG PO TABS: 7.5000 mg | ORAL_TABLET | Freq: Two times a day (BID) | ORAL | Status: DC

## 2018-04-23 MED ORDER — LIDOCAINE HCL (PF) 1 % IJ SOLN: 10.0000 mL | Freq: Once | INTRAMUSCULAR | Status: DC

## 2018-04-23 MED ORDER — CLOMIPRAMINE HCL 25 MG PO CAPS: 50.0000 mg | ORAL_CAPSULE | Freq: Two times a day (BID) | ORAL | Status: DC

## 2018-04-23 NOTE — ED Notes (Signed)
Pt is lying on hospital bed asleep at this time, awaiting d/c papers.
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
## 2018-04-23 NOTE — ED Notes (Signed)
Pt provided with lunch and a drink.

## 2018-04-23 NOTE — Discharge Instructions (Addendum)
Notes from ED Provider:  Dermabond was applied to the deepest cut on the right forearm.  This will fall off on its own most likely in the next 7 days.  Should it open prematurely and he is bleeding, please bring him back to the emergency department.  The wounds can be washed gently with soap and water twice a day.  To the wounds that were not closed with glue, he may apply bacitracin or Neosporin ointment.  Please return if wounds appear significantly reddened, draining, or are bleeding.  The psychiatry team added Ativan, 0.5 mg tablets 3 times a day starting today, 04-23-2018.  Thank you for allowing [place] to participate in your care today.

## 2018-04-23 NOTE — ED Notes (Signed)
PA into repair lav rt wrist.  Pt tolerated procedure well

## 2018-04-23 NOTE — ED Notes (Signed)
Pt wounds cleaned, covered with guaze, tape, wrapped with guaze to deter pt from removing dressings.

## 2018-04-23 NOTE — ED Notes (Signed)
Pt has removed all bandages again and thrown them in the trash. Pt is standing at the sink washing his hands over and over.

## 2018-04-23 NOTE — ED Notes (Signed)
Radiology tech attempting to get portable ex-rays, pt is continuously smacking bedside table with hands and making moaning noises.

## 2018-04-23 NOTE — Progress Notes (Signed)
CSW spoke with [name] and [name] with patients group home- patient able to return at this time. [name] enroute to pick patient up at this time.   CSW notified [name] of new script for patient and instructed her to follow up with out patient provider with [facility] with any questions.   CSW left voicemail with mom/ legal guardian, Nicholas Shepherd, to notify her of discharge from hospital.   [name], LCSW Transitions of Care Department System Wide [facility]  [phone]

## 2018-04-23 NOTE — ED Provider Notes (Signed)
[facility]-EMERGENCY DEPT Provider Note   CSN: [id number] Arrival date & time: 04/23/18  0844    History   Chief Complaint Chief Complaint  Patient presents with  . Psychiatric Evaluation  . Extremity Laceration    bilateral hands    HPI Nicholas Shepherd is a 18 y.o. male.     HPI   Patient is a 18 year old male with past medical history of autism spectrum disorder, nonverbal, seizures, on antiepileptic therapy presenting for lacerations to bilateral forearms and hands after an outburst today.  He also presents for psychiatric evaluation at the request of the group home.  History minimal from the patient as he is only able to gesture at his wounds, and is nonverbal.  Collateral information obtained from Echo, shift supervisor at the [facility] group home who witnessed the outburst today.  He reports that the patient was upset about having to come out of the bathroom and after he was finally asked to come out of the bathroom he "lost" and began knocking over objects in his room, and punched through 2 windows.  He had previously broken a window 2 days ago and had already had some abrasions from that incident.  Group home shift supervisor expresses concerns about the acuity of the patient's needs and whether or not the group home is the appropriate location for him.  He states that they will take him back to the group home, but will need some time to repair his room prior to his return.  Additional collateral information obtained from patient's mother, Victorino Dike, who states that she would like all communications to go through group home owner, Eldridge Abrahams.  She states that the patient has a history of violent outburst since sustaining injury.  She states that she would still like to work out possibilities for care at this particular group home, and that she and the owner, Ms. Pollie Meyer are working on obtaining structure for the patient.  Past Medical History:   Diagnosis Date  . Anxiety   . Autism disorder    severe per legal gaurdian  . Depression   . Nonverbal   . Otitis media   . Seizures (HCC) 11/09/2017   most recent seizure activity on 04/08/18    Patient Active Problem List   Diagnosis Date Noted  . DMDD (disruptive mood dysregulation disorder) (HCC) 11/06/2017  . Autism spectrum disorder 11/06/2017  . Intellectual disability 11/06/2017  . Aggressive behavior 06/09/2017    History reviewed. No pertinent surgical history.      Home Medications    Prior to Admission medications   Medication Sig Start Date End Date Taking? Authorizing Provider  acetaminophen (TYLENOL) 325 MG tablet Take by mouth every 4 (four) hours as needed for mild pain.    [provider]  Aloe Vera GEL Apply 1 application topically 2 (two) times daily as needed (sunburn).    [provider]  alum & mag hydroxide-simeth (MAALOX PLUS) 400-400-40 MG/5ML suspension Take 15 mLs by mouth every 6 (six) hours as needed for indigestion.    [provider]  ARIPiprazole (ABILIFY) 15 MG tablet Take 7.5 mg by mouth 2 (two) times daily.     [provider]  atomoxetine (STRATTERA) 60 MG capsule Take 60 mg by mouth daily.    [provider]  bacitracin 500 UNIT/GM ointment Apply 1 application topically as needed for wound care.    [provider]  bisacodyl (DULCOLAX) 10 MG suppository Place 10 mg rectally as  needed for moderate constipation.    [provider]  carbamide peroxide (DEBROX) 6.5 % OTIC solution 4 drops See admin instructions. into affected ear as needed for wax    [provider]  clomiPRAMINE (ANAFRANIL) 50 MG capsule Take 50 mg by mouth 2 (two) times daily.     [provider]  cloNIDine (CATAPRES) 0.2 MG tablet Take 0.2 mg by mouth 2 (two) times daily.  05/20/17   [provider]  cyproheptadine (PERIACTIN) 4 MG tablet Take 1 tablet (4 mg total) by mouth 2 (two) times  daily. 11/07/17   Charm Rings, NP  Ensure (ENSURE) Take 237 mLs by mouth daily as needed (weight gain).    [provider]  GUAIFENESIN PO Take 1 tablet by mouth daily as needed (congestion).    [provider]  hydrocortisone (ANUSOL-HC) 2.5 % rectal cream Place 1 application rectally 5 (five) times daily. As needed for hemorrhoids    [provider]  hydrocortisone cream 1 % Apply 1 application topically daily as needed for itching.    [provider]  hydrOXYzine (ATARAX/VISTARIL) 50 MG tablet Take 50 mg by mouth 3 (three) times daily. 02/28/18   [provider]  ibuprofen (ADVIL,MOTRIN) 200 MG tablet Take 400 mg by mouth every 4 (four) hours as needed for mild pain.    [provider]  lamoTRIgine (LAMICTAL) 100 MG tablet Take 1 tablet (100 mg total) by mouth 2 (two) times daily. 05/16/18   Penumalli, Glenford Bayley, MD  lamoTRIgine (LAMICTAL) 25 MG tablet Take 3 tablets (75 mg total) by mouth 2 (two) times daily for 14 days, THEN 4 tablets (100 mg total) 2 (two) times daily for 14 days. 04/15/18 05/13/18  Penumalli, Glenford Bayley, MD  loperamide (IMODIUM A-D) 2 MG tablet Take 2 mg by mouth 4 (four) times daily as needed for diarrhea or loose stools.    [provider]  loratadine (CLARITIN) 10 MG tablet Take 10 mg by mouth daily as needed for allergies.    [provider]  magnesium hydroxide (MILK OF MAGNESIA) 400 MG/5ML suspension Take 15 mLs by mouth daily as needed for mild constipation.    [provider]  Phenol-Glycerin (CHLORASEPTIC MAX SORE THROAT MT) Use as directed 1 spray in the mouth or throat every 2 (two) hours as needed (sore throat).    [provider]  Skin Protectants, Misc. (EUCERIN) cream Apply 1 application topically as needed for dry skin.    [provider]  terbinafine (LAMISIL) 1 % cream Apply 1 application topically 2 (two) times daily as needed (fungal infection).     [provider]    Family History Family History  Problem Relation Age of Onset  . Seizures Mother        epilepsy as a child    Social History Social History   Tobacco Use  . Smoking status: Never Smoker  . Smokeless tobacco: Never Used  Substance Use Topics  . Alcohol use: No  . Drug use: No     Allergies   Depakote [divalproex sodium]   Review of Systems Review of Systems  Psychiatric/Behavioral: Negative for agitation. The patient is not hyperactive.    Unable to perform due to intellectual functioning and nonverbal status.   Physical Exam Updated Vital Signs BP 132/73 (BP Location: Left Arm)   Pulse (!) 110   Temp 97.8 F (36.6 C) (Oral)   Resp 16   Ht  (1.753 m)  Wt 59 kg   SpO2 98%   BMI 19.21 kg/m   Physical Exam Vitals signs and nursing note reviewed.  Constitutional:      General: He is not in acute distress.    Appearance: Normal appearance. He is well-developed. He is not diaphoretic.     Comments: Alert and interactive, gesturing at his wounds.   HENT:     Head: Normocephalic and atraumatic.  Eyes:     General:        Right eye: No discharge.        Left eye: No discharge.     Conjunctiva/sclera: Conjunctivae normal.     Comments: EOMs normal to gross examination.  Neck:     Musculoskeletal: Normal range of motion.  Cardiovascular:     Rate and Rhythm: Normal rate and regular rhythm.     Comments: Intact, 2+ radial pulse bilaterally. Abdominal:     General: There is no distension.  Musculoskeletal: Normal range of motion.  Skin:    General: Skin is warm and dry.  Neurological:     Mental Status: He is alert.     Comments: Cranial nerves intact to gross observation. Patient moves extremities without difficulty.  Psychiatric:     Comments: Alert.  Interactive and does follow commands.  Nonverbal.  Appears agitated when multiple individuals particularly on forced manner in the room.      ED Treatments / Results  Labs (all  labs ordered are listed, but only abnormal results are displayed) Labs Reviewed - No data to display  EKG None  Radiology Dg Forearm Right  Result Date: 04/23/2018 CLINICAL DATA:  Pt is from a group home, brought in via EMS with [employer] for psychiatric evaluation and evaluation of hand lacerations on right forearm, and left hand, due to pt breaking through glass windows during an outburst at group home. EXAM: RIGHT FOREARM - 2 VIEW COMPARISON:  None. FINDINGS: No fracture or bone lesion. Wrist and elbow joints are normally spaced and aligned. Evidence of soft tissue injury to the wrist. No radiopaque foreign body. IMPRESSION: No fracture, dislocation or radiopaque foreign body. Electronically Signed   By: Amie Portland M.D.   On: 04/23/2018 10:52   Dg Hand Complete Left  Result Date: 04/23/2018 CLINICAL DATA:  Pt is from a group home, brought in via EMS with [employer] for psychiatric evaluation and evaluation of hand lacerations on right forearm, and left hand, due to pt breaking through glass windows during an outburst at group home. EXAM: LEFT HAND - COMPLETE 3+ VIEW COMPARISON:  None. FINDINGS: No fracture or bone lesion. The joints are normally spaced and aligned. Soft tissues are unremarkable.  No radiopaque foreign body. IMPRESSION: Negative. Electronically Signed   By: Amie Portland M.D.   On: 04/23/2018 10:51   Dg Hand Complete Right  Result Date: 04/23/2018 CLINICAL DATA:  Pt is from a group home, brought in via EMS with [employer] for psychiatric evaluation and evaluation of hand lacerations on right forearm, and left hand, due to pt breaking through glass windows during an outburst at group home. EXAM: RIGHT HAND - COMPLETE 3+ VIEW COMPARISON:  None. FINDINGS: No fracture or bone lesion. The joints are normally spaced and aligned. Soft tissues are unremarkable.  No radiopaque foreign body. IMPRESSION: Negative. Electronically Signed   By: Amie Portland M.D.   On: 04/23/2018 10:50    Procedures  .Marland KitchenLaceration Repair Date/Time: 04/23/2018 4:52 PM Performed by: Elisha Ponder, PA-C Authorized by: Elisha Ponder,  PA-C   Consent:    Consent obtained:  Verbal   Consent given by:  Parent and guardian   Risks discussed:  Need for additional repair and poor cosmetic result Anesthesia (see MAR for exact dosages):    Anesthesia method:  Topical application   Topical anesthetic:  LET Laceration details:    Location:  Shoulder/arm   Shoulder/arm location:  R lower arm   Length (cm):  2 Repair type:    Repair type:  Simple Pre-procedure details:    Preparation:  Imaging obtained to evaluate for foreign bodies Exploration:    Hemostasis achieved with:  Direct pressure   Wound exploration: wound explored through full range of motion     Contaminated: no   Treatment:    Area cleansed with:  Saline   Amount of cleaning:  Standard   Irrigation solution:  Sterile saline Skin repair:    Repair method:  Tissue adhesive Approximation:    Approximation:  Close Post-procedure details:    Dressing:  Non-adherent dressing   Patient tolerance of procedure:  Tolerated well, no immediate complications   (including critical care time)  Medications Ordered in ED Medications  Tdap (BOOSTRIX) injection 0.5 mL (has no administration in time range)  lidocaine (XYLOCAINE) 1 % (with pres) injection 20 mL (has no administration in time range)  lidocaine-EPINEPHrine-tetracaine (LET) solution (3 mLs Topical Given 04/23/18 1025)     Initial Impression / Assessment and Plan / ED Course  I have reviewed the triage vital signs and the nursing notes.  Pertinent labs & imaging results that were available during my care of the patient were reviewed by me and considered in my medical decision making (see chart for details).        Patient is well-appearing and in no acute distress.  He is cooperative here in the emergency department and permitted primary closure of the laceration on his right  forearm.  Consent obtained per his mother, but she recommended Dermabond and only suturing as last resort.  He did tolerate Dermabond. Tdap status up to date.   No reason for psychiatric decompensation today.  Patient is at his baseline.  He is behaving cooperatively here in the emergency department.  Radiographs of bilateral hands and right forearm reveal no foreign body or fracture.  Wounds cleansed and bandaged.  Social work involved with case, and determined safe discharge for patient back to the group home with group home owner, Eldridge Abrahams.  Appreciate her involvement.  Medication management performed per Nanine Means, FNP.  Additionally, social work contacted patient's group home to determine further needs.   Final Clinical Impressions(s) / ED Diagnoses   Final diagnoses:  DMDD (disruptive mood dysregulation disorder) (HCC)  Autism spectrum disorder    ED Discharge Orders         Ordered    LORazepam (ATIVAN) 0.5 MG tablet  3 times daily     04/23/18 1404    Increase activity slowly     04/23/18 1405    Diet - low sodium heart healthy     04/23/18 1405    Discharge instructions    Comments:  Follow up with outpatient provider, Dr Jannifer Franklin at [facility]   04/23/18 [address] 04/23/18 1653    Gwyneth Sprout, MD 04/24/18 1118

## 2018-04-25 ENCOUNTER — Telehealth: Payer: Self-pay | Admitting: Diagnostic Neuroimaging

## 2018-04-25 NOTE — Telephone Encounter (Signed)
I reviewed notes. Lamotrigine should be mood stabilizing. Also pt has long history of aggressive behavior / mood changes. Recommend to continue lamotrigine and follow up with psychiatry.  -VRP

## 2018-04-25 NOTE — Telephone Encounter (Signed)
Received call back from Maralyn Sago and advised her of Dr Pneummli's reply. She stated she would contact psychiatry,  verbalized understanding, appreciation of call back.

## 2018-04-25 NOTE — Telephone Encounter (Signed)
Sarah/[facility] [place] [phone] said since increasing lamoTRIgine (LAMICTAL) 25 MG tablet to 75mg  2 x day last Thursday the patient has been experiencing behavioral changes, breaking window, throwing stuff. She said he was taken to ED on 04/20/18 with the 1st signs of behavioral changes. Please call to advise.

## 2018-04-25 NOTE — Telephone Encounter (Signed)
LVM for Nicholas Shepherd requesting she call back to discuss.

## 2018-05-11 ENCOUNTER — Telehealth: Payer: Self-pay | Admitting: Family Medicine

## 2018-05-11 MED ORDER — LAMOTRIGINE 100 MG PO TABS: 100.0000 mg | ORAL_TABLET | Freq: Two times a day (BID) | ORAL | 12 refills | Status: DC

## 2018-05-11 NOTE — Telephone Encounter (Signed)
Spoke to Maralyn Sago of [facility] in [place]. Confirmed pt has been taking 100mg  po bid (in 25mg  tab form) but prescription for 100mg  tabs done previously was voided.  Needed it redone.  Escribed to pharmacy.  She verbalized understanding.

## 2018-05-11 NOTE — Telephone Encounter (Signed)
Sarah @ [facility] called to inform that they received a fax for lamoTRIgine (LAMICTAL) 100 MG tablet with void written on it.  Maralyn Sago is asking that a prescription be sent to them without void on the front

## 2018-05-11 NOTE — Addendum Note (Signed)
Addended by: Guy Begin on: 05/11/2018 03:42 PM   Modules accepted: Orders

## 2018-05-16 ENCOUNTER — Ambulatory Visit: Payer: Medicaid Other | Admitting: Diagnostic Neuroimaging

## 2018-05-25 ENCOUNTER — Telehealth: Payer: Self-pay | Admitting: Diagnostic Neuroimaging

## 2018-05-25 ENCOUNTER — Encounter: Payer: Self-pay | Admitting: Diagnostic Neuroimaging

## 2018-05-25 NOTE — Telephone Encounter (Signed)
Pt's nurse called in and stated that the pt has had recent seizure activity and is needing to be seen. They have consented to a Virtual Visit and for the insurance to be billed as such. The phone number that they would like the Doxy information sent to is [phone] and the carrier is [employer].

## 2018-05-25 NOTE — Telephone Encounter (Signed)
Graciella Belton, pt's nurse at group home. She stated he had a seizure on 04/28/18 that lasted 4 minutes with incontinence, described as grand mal. She stated low blood sugar could be trigger, was 62 with previous seizure. Blood sugar not checked on 04/28/18 but they will check it in the future with any seizure activity. She denied missed doses of medication, illness. She asked that her e mail be used for VV: [email or web address]. she will fax over his current med list for reconciliation. No other updates to EMR. She verbalized understanding, appreciation. E mail sent.

## 2018-05-26 NOTE — Telephone Encounter (Signed)
Received medication list form group home; meds reconciled.

## 2018-05-26 NOTE — Addendum Note (Signed)
Addended by: [place] Pink on: 05/26/2018 02:41 PM   Modules accepted: Orders

## 2018-05-30 ENCOUNTER — Telehealth: Payer: Self-pay | Admitting: *Deleted

## 2018-05-30 ENCOUNTER — Ambulatory Visit (INDEPENDENT_AMBULATORY_CARE_PROVIDER_SITE_OTHER): Payer: Medicaid Other | Admitting: Diagnostic Neuroimaging

## 2018-05-30 ENCOUNTER — Other Ambulatory Visit: Payer: Self-pay

## 2018-05-30 DIAGNOSIS — G40909 Epilepsy, unspecified, not intractable, without status epilepticus: Secondary | ICD-10-CM | POA: Diagnosis not present

## 2018-05-30 DIAGNOSIS — F840 Autistic disorder: Secondary | ICD-10-CM | POA: Diagnosis not present

## 2018-05-30 NOTE — Progress Notes (Signed)
    Virtual Visit via Video Note  I connected with Nicholas Shepherd on 05/30/18 at  2:30 PM EDT by a video enabled telemedicine application and verified that I am speaking with the correct person using two identifiers. I spoke with Nicholas Shepherd at the group home.    I discussed the limitations of evaluation and management by telemedicine and the availability of in person appointments. The patient expressed understanding and agreed to proceed.  Patient is at their group home. I am at the office.    History of Present Illness:  - since last visit, continues with seizures and aggressive behaviors - last seizure was 04/28/18; now on higher lamotrigine 100mg  twice a day     Observations/Objective:  - awake, alert - standing; non-verbal; not following commands   Assessment and Plan:  SEIZURE DISORDER (autism, pervasive developmental delay) - continue lamotrigine 100mg  twice a day    Follow Up Instructions:  - Return in about 6 months (around 11/30/2018) for with NP (Amy Lomax).     I discussed the assessment and treatment plan with the patient. The patient was provided an opportunity to ask questions and all were answered. The patient agreed with the plan and demonstrated an understanding of the instructions.   The patient was advised to call back or seek an in-person evaluation if the symptoms worsen or if the condition fails to improve as anticipated.  I provided 15 minutes of non-face-to-face time during this encounter.   Nicholas Marker, MD 05/30/2018, 2:48 PM Certified in Neurology, Neurophysiology and Neuroimaging  [facility] [address], [address] [place], [place] [address] [phone]

## 2018-05-30 NOTE — Telephone Encounter (Signed)
Received message form mom that she didn't get e mail for video visit. I called her and advised it was scheduled with her son's nurse, [name]. Victorino Dike said she has just talked to Maralyn Sago, and she is ready for doxy.me visit. Victorino Dike stated Maralyn Sago may talk to Dr Marjory Lies and  thanked me for call.

## 2018-07-25 ENCOUNTER — Other Ambulatory Visit: Payer: Self-pay

## 2018-07-25 ENCOUNTER — Telehealth: Payer: Self-pay

## 2018-07-25 MED ORDER — LAMOTRIGINE 100 MG PO TABS: 100.0000 mg | ORAL_TABLET | Freq: Two times a day (BID) | ORAL | 12 refills | Status: DC

## 2018-07-25 NOTE — Telephone Encounter (Signed)
Sarah from the patient's group home called to see if paperwork she had dropped off last week was ready? She stated that it was a large brown envelope. She can be reached at [phone].

## 2018-07-25 NOTE — Telephone Encounter (Signed)
I called Nicholas Shepherd about paperwork for pt.She stated the form are physician orders for Dr. Randel Pigg to sign.I stated the form is to sign for all the pts medications and we only manage pts Lamictal.I advise [name] that the pts primary will need to sign the the physician orders for the other meds. I ask [name] can she print physician orders with just lamictal listed. [name] stated when they print the form it includes all meds.  I stated message will be sent to Dr Leta Baptist to review the papers.The physician order forms date back to March to be sign.[name] verbalized understanding.

## 2018-07-25 NOTE — Telephone Encounter (Signed)
I called [name] back about med orders need to be sign. I stated per Dr.Penumalli the pts primary or attending physician is the one typically who signs orders for patients routine  medications. [name] stated the PCP at [facility] is refusing to sign the physician order form because lamictal is listed and manage by neurology. The group home is mange by nurses and they dont have a MD who signs orders. The pt also has other medications that are manage by another physician. The pt is changing primary doctors. [name] stated she will come by the office and pick up the physician order forms. She verbalized understanding.

## 2018-07-26 NOTE — Telephone Encounter (Signed)
Called Sarah with group home and informed her the Rx for lamictal is ready. She stated it needs to be faxed to their pharmacy, [facility], [place], [place], [place]. She will also keep the paper Rx for his file at the group home. I advised her I'll fax Rx then put all papers at front desk for her to pick up. She  verbalized understanding, appreciation. Faxed Rx to [facility]; received confirmation of fax. All papers placed at front desk for [name] RN to pick up.

## 2018-08-02 ENCOUNTER — Encounter: Payer: Self-pay | Admitting: Diagnostic Neuroimaging

## 2018-08-02 ENCOUNTER — Other Ambulatory Visit: Payer: Self-pay

## 2018-08-02 ENCOUNTER — Ambulatory Visit (INDEPENDENT_AMBULATORY_CARE_PROVIDER_SITE_OTHER): Payer: Medicaid Other | Admitting: Diagnostic Neuroimaging

## 2018-08-02 ENCOUNTER — Ambulatory Visit: Payer: Medicaid Other | Admitting: Diagnostic Neuroimaging

## 2018-08-02 VITALS — BP 124/80 | HR 115 | Temp 98.2°F | Ht 69.5 in | Wt 152.6 lb

## 2018-08-02 DIAGNOSIS — F840 Autistic disorder: Secondary | ICD-10-CM | POA: Diagnosis not present

## 2018-08-02 DIAGNOSIS — G40909 Epilepsy, unspecified, not intractable, without status epilepticus: Secondary | ICD-10-CM | POA: Diagnosis not present

## 2018-08-02 NOTE — Progress Notes (Signed)
[facility]  PATIENT: Nicholas Shepherd DOB: [birth year]  REFERRING CLINICIAN:  HISTORY FROM: patient's mother REASON FOR VISIT: follow up   HISTORICAL  CHIEF COMPLAINT:  Chief Complaint  Patient presents with  . Seizures    rm 7, mom- Anderson Malta, uncleBeverely Low, "one small seizure two weeks ago, lasted about 90 seconds"    HISTORY OF PRESENT ILLNESS:   UPDATE (08/02/18, VRP): Since last visit, doing well. Symptoms are stable. Only 1 minor seizure 2 weeks ago (eyes fluttering, minor shaking; 90 seconds). Tolerating lamotrigine 100mg  twice a day. 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  For past few months patient has been having increasing intermittent behavioral outbursts, managed by psychiatry Dr.Akintayo.  Patient was at school when he had a seizure and fell out of the chair and hit his head, bit his tongue. Seizure lasted approximately 4 minutes. Patient was taken to the hospital for evaluation. CT of the head was unremarkable. Lab testing could not be done as patient was uncooperative. Patient was discharged for follow-up in neurology clinic.  No similar events in the past. No prior history of seizures. No family history of seizure.    REVIEW OF SYSTEMS: Full 14 system review of systems performed and negative with exception of: as per HPI  ALLERGIES: Allergies  Allergen Reactions  . Depakote [Divalproex Sodium] Other (See Comments)    Made patient angry and his behavioral issues worsened     HOME MEDICATIONS: Outpatient Medications Prior to Visit  Medication Sig Dispense Refill  . acetaminophen (TYLENOL) 325 MG tablet Take by mouth every 4 (four) hours as needed for mild pain.    . Aloe Vera GEL Apply 1 application topically 2 (two) times daily as needed (sunburn).    Marland Kitchen alum & mag hydroxide-simeth (MAALOX PLUS) 400-400-40 MG/5ML suspension Take 15 mLs by mouth  every 6 (six) hours as needed for indigestion.    . ARIPiprazole (ABILIFY) 15 MG tablet Take 7.5 mg by mouth 2 (two) times daily.     Marland Kitchen atomoxetine (STRATTERA) 60 MG capsule Take 60 mg by mouth daily.    . bacitracin 500 UNIT/GM ointment Apply 1 application topically as needed for wound care.    . bisacodyl (DULCOLAX) 10 MG suppository Place 10 mg rectally as needed for moderate constipation.    . carbamide peroxide (DEBROX) 6.5 % OTIC solution 4 drops See admin instructions. into affected ear as needed for wax    . clomiPRAMINE (ANAFRANIL) 50 MG capsule Take 50 mg by mouth 2 (two) times daily.     . cloNIDine (CATAPRES) 0.2 MG tablet Take 0.2 mg by mouth 2 (two) times daily.   1  . cyproheptadine (PERIACTIN) 4 MG tablet Take 1 tablet (4 mg total) by mouth 2 (two) times daily. 60 tablet 0  . Ensure (ENSURE) Take 237 mLs by mouth daily as needed (weight gain).    . GUAIFENESIN PO Take 1 tablet by mouth daily as needed (congestion).    . hydrocortisone (ANUSOL-HC) 2.5 % rectal cream Place 1 application rectally 5 (five) times daily. As needed for hemorrhoids    . hydrocortisone cream 1 % Apply 1 application topically daily as needed for itching.    . hydrOXYzine (ATARAX/VISTARIL) 50 MG tablet Take 50 mg by mouth 3 (three) times daily.    Marland Kitchen ibuprofen (ADVIL,MOTRIN) 200 MG tablet Take 400 mg by mouth every 4 (four) hours as needed for mild pain.    Marland Kitchen  lamoTRIgine (LAMICTAL) 100 MG tablet Take 1 tablet (100 mg total) by mouth 2 (two) times daily. 60 tablet 12  . loperamide (IMODIUM A-D) 2 MG tablet Take 2 mg by mouth 4 (four) times daily as needed for diarrhea or loose stools.    Marland Kitchen. loratadine (CLARITIN) 10 MG tablet Take 10 mg by mouth daily as needed for allergies.    Marland Kitchen. LORazepam (ATIVAN) 0.5 MG tablet Take 1 tablet (0.5 mg total) by mouth 3 (three) times daily. 30 tablet 0  . magnesium hydroxide (MILK OF MAGNESIA) 400 MG/5ML suspension Take 15 mLs by mouth daily as needed for mild constipation.    .  Multiple Vitamin (MULTIVITAMIN) tablet Take 1 tablet by mouth daily.    . Phenol-Glycerin (CHLORASEPTIC MAX SORE THROAT MT) Use as directed 1 spray in the mouth or throat every 2 (two) hours as needed (sore throat).    . QUEtiapine (SEROQUEL) 100 MG tablet Take 100 mg by mouth at bedtime.    . Skin Protectants, Misc. (EUCERIN) cream Apply 1 application topically as needed for dry skin.    . Sodium Phosphates (FLEET ENEMA RE) Place rectally.    . terbinafine (LAMISIL) 1 % cream Apply 1 application topically 2 (two) times daily as needed (fungal infection).     . traZODone (DESYREL) 100 MG tablet Take 200 mg by mouth at bedtime.    . ziprasidone (GEODON) 40 MG capsule Take 40 mg by mouth 2 (two) times daily with a meal.     No facility-administered medications prior to visit.      PHYSICAL EXAM  GENERAL EXAM/CONSTITUTIONAL: Vitals:  Vitals:   08/02/18 0820  BP: 124/80  Pulse: (!) 115  Temp: 98.2 F (36.8 C)  Weight: 152 lb 9.6 oz (69.2 kg)  Height: 5' 9.5" (1.765 m)     Body mass index is 22.21 kg/m. Wt Readings from Last 3 Encounters:  08/02/18 152 lb 9.6 oz (69.2 kg) (46 %, Z= -0.11)*  04/23/18 130 lb 1.1 oz (59 kg) (12 %, Z= -1.17)*  04/08/18 130 lb (59 kg) (12 %, Z= -1.17)*   * Growth percentiles are based on CDC (Boys, 2-20 Years) data.     Patient is in no distress; well developed, nourished and groomed; neck is supple  Awake, alert  Non verbal; follows simple commands  Moves all ext symm  Gait and balance stable    DIAGNOSTIC DATA (LABS, IMAGING, TESTING) - I reviewed patient records, labs, notes, testing and imaging myself where available.  Lab Results  Component Value Date   WBC 9.7 04/08/2018   HGB 15.6 04/08/2018   HCT 47.0 04/08/2018   MCV 89.4 04/08/2018   PLT 322 04/08/2018      Component Value Date/Time   NA 137 04/08/2018 1209   K 4.2 04/08/2018 1209   CL 102 04/08/2018 1209   CO2 25 04/08/2018 1209   GLUCOSE 61 (L) 04/08/2018 1209    BUN 14 04/08/2018 1209   CREATININE 0.92 04/08/2018 1209   CALCIUM 10.0 04/08/2018 1209   PROT 7.8 04/08/2018 1209   ALBUMIN 4.8 04/08/2018 1209   AST 149 (H) 04/08/2018 1209   ALT 47 (H) 04/08/2018 1209   ALKPHOS 98 04/08/2018 1209   BILITOT 0.5 04/08/2018 1209   GFRNONAA >60 04/08/2018 1209   GFRAA >60 04/08/2018 1209   No results found for: CHOL, HDL, LDLCALC, LDLDIRECT, TRIG, CHOLHDL No results found for: [id number] No results found for: VITAMINB12 Lab Results  Component Value Date   TSH  1.628 01/04/2018    11/09/17 CT head: Inferomedial left frontal scalp hematoma. No fracture. Brain parenchyma appears unremarkable. No mass or hemorrhage.  11/09/17 CT cervical spine: No fracture or spondylolisthesis. No evident arthropathy.    ASSESSMENT AND PLAN  18 y.o. year old male here with:   Dx:  1. Seizure disorder (HCC)   2. Autism      PLAN:  - continue lamotrigine 100mg  twice a day  - seizure precautions reviewed  No follow-ups on file.    Suanne MarkerVIKRAM R. Ajanee Buren, MD 08/02/2018, 9:01 AM Certified in Neurology, Neurophysiology and Neuroimaging  [facility] [address], [address] [place], [place] [address] [phone]

## 2018-08-30 ENCOUNTER — Encounter

## 2018-08-30 ENCOUNTER — Ambulatory Visit: Payer: Medicaid Other | Admitting: Diagnostic Neuroimaging

## 2018-11-29 NOTE — Progress Notes (Signed)
PATIENT: Nicholas Shepherd DOB: 05-Nov-2000  REASON FOR VISIT: follow up HISTORY FROM: patient  Chief Complaint  Patient presents with   Follow-up    6 mon f/u. Nurse and manager from the group home present. Rm 2. No new concerns at this time.      HISTORY OF PRESENT ILLNESS: Today 11/30/18 Nicholas Shepherd is a 18 y.o. male here today with history of nonverbal autism for follow up for seizure disorder. He continues lamotrigine 100mg  twice daily. He has not had any seizure activity recently. Nurse and caregiver present today report last seizure.  He continues to reside in a group home.  He is followed closely by psychiatry.   History (copied from Dr note on 08/02/2018)  UPDATE (08/02/18, VRP): Since last visit, doing well. Symptoms are stable. Only 1 minor seizure 2 weeks ago (eyes fluttering, minor shaking; 90 seconds). Tolerating lamotrigine 100mg  twice a day. 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  For past few months patient has been having increasing intermittent behavioral outbursts, managed by psychiatry Dr.Akintayo.  Patient was at school when he had a seizure and fell out of the chair and hit his head, bit his tongue. Seizure lasted approximately 4 minutes. Patient was taken to the hospital for evaluation. CT of the head was unremarkable. Lab testing could not be done as patient was uncooperative. Patient was discharged for follow-up in neurology clinic.  No similar events in the past. No prior history of seizures. No family history of seizure.   HISTORY: (copied from my note on 03/18/2018)  Nicholas Shepherd is a 18 y.o. male here today for follow up of seizures.  He has taken lamotrigine 25 mg twice daily since last being seen in October 2019.  Mom reports being unaware of suggested dose of 50 mg twice daily.  Nicholas Shepherd has tolerated medication well with no obvious adverse  effects.  Unfortunately he did have a seizure yesterday at school.  This was a witnessed tonic-clonic event.  Seizure lasted a little over 4 minutes.  He was incontinent during event.  No tongue trauma noted.  His teacher was able to catch him prior to his fall.  They do not think he hit his head.  Mom reports that he was quite confused for short period of time following the seizure.  He was evaluated in the ER and discharged with follow-up here.  Work-up was unremarkable.  White blood cell count 12.4.  Mom denies any concerns of sickness.  No fever or URI symptoms noted.  He has not missed any doses of medication.  This is the only seizure activity she has noted since his last visit.  He is nonverbal at baseline.  He is followed very closely by psychiatry for autism.  He does not drive.   REVIEW OF SYSTEMS: Out of a complete 14 system review of symptoms, the patient complains only of the following symptoms, none and all other reviewed systems are negative.  ALLERGIES: Allergies  Allergen Reactions   Depakote [Divalproex Sodium] Other (See Comments)    Made patient angry and his behavioral issues worsened     HOME MEDICATIONS: Outpatient Medications Prior to Visit  Medication Sig Dispense Refill   Aloe Vera GEL Apply 1 application topically 2 (two) times daily as needed (sunburn).     alum & mag hydroxide-simeth (MAALOX PLUS) 400-400-40 MG/5ML suspension Take 15 mLs by mouth every 6 (six) hours as needed  for indigestion.     bacitracin 500 UNIT/GM ointment Apply 1 application topically as needed for wound care.     benztropine (COGENTIN) 1 MG tablet Take 1 mg by mouth daily.     bisacodyl (DULCOLAX) 10 MG suppository Place 10 mg rectally as needed for moderate constipation.     carbamide peroxide (DEBROX) 6.5 % OTIC solution 4 drops See admin instructions. into affected ear as needed for wax     clomiPRAMINE (ANAFRANIL) 50 MG capsule Take 50 mg by mouth 2 (two) times daily.       cloNIDine (CATAPRES) 0.2 MG tablet Take 0.2 mg by mouth 2 (two) times daily.   1   Ensure (ENSURE) Take 237 mLs by mouth daily as needed (weight gain).     GUAIFENESIN PO Take 1 tablet by mouth daily as needed (congestion).     haloperidol (HALDOL) 5 MG tablet Take 5 mg by mouth 3 (three) times daily.     hydrocortisone (ANUSOL-HC) 2.5 % rectal cream Place 1 application rectally 5 (five) times daily. As needed for hemorrhoids     hydrocortisone cream 1 % Apply 1 application topically daily as needed for itching.     hydrOXYzine (ATARAX/VISTARIL) 50 MG tablet Take 50 mg by mouth 3 (three) times daily.     ibuprofen (ADVIL,MOTRIN) 200 MG tablet Take 400 mg by mouth every 4 (four) hours as needed for mild pain.     lamoTRIgine (LAMICTAL) 100 MG tablet Take 1 tablet (100 mg total) by mouth 2 (two) times daily. 60 tablet 12   loperamide (IMODIUM A-D) 2 MG tablet Take 2 mg by mouth 4 (four) times daily as needed for diarrhea or loose stools.     loratadine (CLARITIN) 10 MG tablet Take 10 mg by mouth daily as needed for allergies.     magnesium hydroxide (MILK OF MAGNESIA) 400 MG/5ML suspension Take 15 mLs by mouth daily as needed for mild constipation.     Multiple Vitamin (MULTIVITAMIN) tablet Take 1 tablet by mouth daily.     phenol (CHLORASEPTIC) 1.4 % LIQD Use as directed 3 sprays in the mouth or throat every 2 (two) hours as needed for throat irritation / pain.     Phenol-Glycerin (CHLORASEPTIC MAX SORE THROAT MT) Use as directed 1 spray in the mouth or throat every 2 (two) hours as needed (sore throat).     QUEtiapine (SEROQUEL) 100 MG tablet Take 100 mg by mouth at bedtime.     Skin Protectants, Misc. (EUCERIN) cream Apply 1 application topically as needed for dry skin.     Sodium Phosphates (FLEET ENEMA RE) Place rectally.     terbinafine (LAMISIL) 1 % cream Apply 1 application topically 2 (two) times daily as needed (fungal infection).      traZODone (DESYREL) 100 MG tablet  Take 200 mg by mouth at bedtime.     ziprasidone (GEODON) 60 MG capsule Take 60 mg by mouth 2 (two) times daily with a meal.     acetaminophen (TYLENOL) 325 MG tablet Take by mouth every 4 (four) hours as needed for mild pain.     ARIPiprazole (ABILIFY) 15 MG tablet Take 7.5 mg by mouth 2 (two) times daily.      atomoxetine (STRATTERA) 60 MG capsule Take 60 mg by mouth daily.     cyproheptadine (PERIACTIN) 4 MG tablet Take 1 tablet (4 mg total) by mouth 2 (two) times daily. (Patient not taking: Reported on 11/30/2018) 60 tablet 0   LORazepam (ATIVAN) 0.5  MG tablet Take 1 tablet (0.5 mg total) by mouth 3 (three) times daily. (Patient not taking: Reported on 11/30/2018) 30 tablet 0   ziprasidone (GEODON) 40 MG capsule Take 40 mg by mouth 2 (two) times daily with a meal.     No facility-administered medications prior to visit.     PAST MEDICAL HISTORY: Past Medical History:  Diagnosis Date   Anxiety    Autism disorder    severe per legal gaurdian   Depression    Nonverbal    Otitis media    Seizures (HCC) 11/09/2017, 03/2018   most recent seizure activity on 04/28/18 x 4 minutes    PAST SURGICAL HISTORY: History reviewed. No pertinent surgical history.  FAMILY HISTORY: Family History  Problem Relation Age of Onset   Seizures Mother        epilepsy as a child    SOCIAL HISTORY: Social History   Socioeconomic History   Marital status: Single    Spouse name: Not on file   Number of children: 0   Years of education: 12   Highest education level: Not on file  Occupational History    Comment: NA  Social Network engineer strain: Not on file   Food insecurity    Worry: Not on file    Inability: Not on file   Transportation needs    Medical: Not on file    Non-medical: Not on file  Tobacco Use   Smoking status: Never Smoker   Smokeless tobacco: Never Used  Substance and Sexual Activity   Alcohol use: No   Drug use: No   Sexual activity:  Never  Lifestyle   Physical activity    Days per week: Not on file    Minutes per session: Not on file   Stress: Not on file  Relationships   Social connections    Talks on phone: Not on file    Gets together: Not on file    Attends religious service: Not on file    Active member of club or organization: Not on file    Attends meetings of clubs or organizations: Not on file    Relationship status: Not on file   Intimate partner violence    Fear of current or ex partner: Not on file    Emotionally abused: Not on file    Physically abused: Not on file    Forced sexual activity: Not on file  Other Topics Concern   Not on file  Social History Narrative   Lives with mother, legal guardian   08/02/2018 living at [facility] group home   No caffeine      PHYSICAL EXAM  Vitals:   11/30/18 1341  Temp: 98 F (36.7 C)  TempSrc: Oral  Weight: 165 lb (74.8 kg)  Height:  (1.753 m)   Body mass index is 24.37 kg/m.  Generalized: Well developed, in no acute distress  Cardiology: normal rate and rhythm, no murmur noted Neurological examination  Mentation: Alert, patient nonverbal.  He is hitting the desk with his right fist repetitively.  Follows very limited commands. Cranial nerve II-XII: Pupils were equal round reactive to light. Unable to asses Motor: The motor testing reveals 5 over 5 strength of all 4 extremities. Good symmetric motor tone is noted throughout.  Sensory: unable to assess Coordination: unable to assess  Gait and station: Gait is normal.    DIAGNOSTIC DATA (LABS, IMAGING, TESTING) - I reviewed patient records, labs, notes, testing and imaging  myself where available.  No flowsheet data found.   Lab Results  Component Value Date   WBC 9.7 04/08/2018   HGB 15.6 04/08/2018   HCT 47.0 04/08/2018   MCV 89.4 04/08/2018   PLT 322 04/08/2018      Component Value Date/Time   NA 137 04/08/2018 1209   K 4.2 04/08/2018 1209   CL 102 04/08/2018 1209    CO2 25 04/08/2018 1209   GLUCOSE 61 (L) 04/08/2018 1209   BUN 14 04/08/2018 1209   CREATININE 0.92 04/08/2018 1209   CALCIUM 10.0 04/08/2018 1209   PROT 7.8 04/08/2018 1209   ALBUMIN 4.8 04/08/2018 1209   AST 149 (H) 04/08/2018 1209   ALT 47 (H) 04/08/2018 1209   ALKPHOS 98 04/08/2018 1209   BILITOT 0.5 04/08/2018 1209   GFRNONAA >60 04/08/2018 1209   GFRAA >60 04/08/2018 1209   No results found for: CHOL, HDL, LDLCALC, LDLDIRECT, TRIG, CHOLHDL No results found for: [id number] No results found for: VITAMINB12 Lab Results  Component Value Date   TSH 1.628 01/04/2018    ASSESSMENT AND PLAN 18 y.o. year old male  has a past medical history of Anxiety, Autism disorder, Depression, Nonverbal, Otitis media, and Seizures (HCC) (11/09/2017, 03/2018). here with     ICD-10-CM   1. Seizure disorder (HCC)  G40.909     Nicholas Shepherd appears to be doing well from a seizure standpoint.  Group home nurse and caregiver both deny any seizure activity since 02/2018.  Behavioral concerns wax and wane.  He is followed closely by psychiatry.  Component nurse states that labs are checked every 6 months.  She will fax me a copy of these labs for his chart.  We will continue lamotrigine 100 mg twice daily.  Seizure precautions discussed.  He will follow-up in 6 months, sooner if needed.  Group home nursing caregiver both verbalized understanding and agreement with this plan.   No orders of the defined types were placed in this encounter.    No orders of the defined types were placed in this encounter.     I spent 15 minutes with the patient. 50% of this time was spent counseling and educating patient on plan of care and medications.    Shawnie Dappermy Pearley Baranek, FNP-C 11/30/2018, 4:07 PM [facility] [address], [address] [place], [place] [address] [phone]

## 2018-11-30 ENCOUNTER — Ambulatory Visit (INDEPENDENT_AMBULATORY_CARE_PROVIDER_SITE_OTHER): Payer: Medicare Other | Admitting: Family Medicine

## 2018-11-30 ENCOUNTER — Ambulatory Visit: Payer: Medicaid Other | Admitting: Family Medicine

## 2018-11-30 ENCOUNTER — Other Ambulatory Visit: Payer: Self-pay

## 2018-11-30 ENCOUNTER — Encounter: Payer: Self-pay | Admitting: Family Medicine

## 2018-11-30 VITALS — Temp 98.0°F | Ht 69.0 in | Wt 165.0 lb

## 2018-11-30 DIAGNOSIS — G40909 Epilepsy, unspecified, not intractable, without status epilepticus: Secondary | ICD-10-CM

## 2018-11-30 NOTE — Patient Instructions (Addendum)
Continue lamotrigine 100mg  twice daily  Follow up in 6 months   Seizure, Adult A seizure is a sudden burst of abnormal electrical activity in the brain. Seizures usually last from 30 seconds to 2 minutes. They can cause many different symptoms. Usually, seizures are not harmful unless they last a long time. What are the causes? Common causes of this condition include:  Fever or infection.  Conditions that affect the brain, such as: ? A brain abnormality that you were born with. ? A brain or head injury. ? Bleeding in the brain. ? A tumor. ? Stroke. ? Brain disorders such as autism or cerebral palsy.  Low blood sugar.  Conditions that are passed from parent to child (are inherited).  Problems with substances, such as: ? Having a reaction to a drug or a medicine. ? Suddenly stopping the use of a substance (withdrawal). In some cases, the cause may not be known. A person who has repeated seizures over time without a clear cause has a condition called epilepsy. What increases the risk? You are more likely to get this condition if you have:  A family history of epilepsy.  Had a seizure in the past.  A brain disorder.  A history of head injury, lack of oxygen at birth, or strokes. What are the signs or symptoms? There are many types of seizures. The symptoms vary depending on the type of seizure you have. Examples of symptoms during a seizure include:  Shaking (convulsions).  Stiffness in the body.  Passing out (losing consciousness).  Head nodding.  Staring.  Not responding to sound or touch.  Loss of bladder control and bowel control. Some people have symptoms right before and right after a seizure happens. Symptoms before a seizure may include:  Fear.  Worry (anxiety).  Feeling like you may vomit (nauseous).  Feeling like the room is spinning (vertigo).  Feeling like you saw or heard something before (dj vu).  Odd tastes or smells.  Changes in how  you see. You may see flashing lights or spots. Symptoms after a seizure happens can include:  Confusion.  Sleepiness.  Headache.  Weakness on one side of the body. How is this treated? Most seizures will stop on their own in under 5 minutes. In these cases, no treatment is needed. Seizures that last longer than 5 minutes will usually need treatment. Treatment can include:  Medicines given through an IV tube.  Avoiding things that are known to cause your seizures. These can include medicines that you take for another condition.  Medicines to treat epilepsy.  Surgery to stop the seizures. This may be needed if medicines do not help. Follow these instructions at home: Medicines  Take over-the-counter and prescription medicines only as told by your doctor.  Do not eat or drink anything that may keep your medicine from working, such as alcohol. Activity  Do not do any activities that would be dangerous if you had another seizure, like driving or swimming. Wait until your doctor says it is safe for you to do them.  If you live in the [place] ask your local DMV (department of motor vehicles) when you can drive.  Get plenty of rest. Teaching others Teach friends and family what to do when you have a seizure. They should:  Lay you on the ground.  Protect your head and body.  Loosen any tight clothing around your neck.  Turn you on your side.  Not hold you down.  Not put anything into  your mouth.  Know whether or not you need emergency care.  Stay with you until you are better.  General instructions  Contact your doctor each time you have a seizure.  Avoid anything that gives you seizures.  Keep a seizure diary. Write down: ? What you think caused each seizure. ? What you remember about each seizure.  Keep all follow-up visits as told by your doctor. This is important. Contact a doctor if:  You have another seizure.  You have seizures more often.  There is any  change in what happens during your seizures.  You keep having seizures with treatment.  You have symptoms of being sick or having an infection. Get help right away if:  You have a seizure that: ? Lasts longer than 5 minutes. ? Is different than seizures you had before. ? Makes it harder to breathe. ? Happens after you hurt your head.  You have any of these symptoms after a seizure: ? Not being able to speak. ? Not being able to use a part of your body. ? Confusion. ? A bad headache.  You have two or more seizures in a row.  You do not wake up right after a seizure.  You get hurt during a seizure. These symptoms may be an emergency. Do not wait to see if the symptoms will go away. Get medical help right away. Call your local emergency services (911 in the [place] Do not drive yourself to the hospital. Summary  Seizures usually last from 30 seconds to 2 minutes. Usually, they are not harmful unless they last a long time.  Do not eat or drink anything that may keep your medicine from working, such as alcohol.  Teach friends and family what to do when you have a seizure.  Contact your doctor each time you have a seizure. This information is not intended to replace advice given to you by your health care provider. Make sure you discuss any questions you have with your health care provider. Document Released: 06/24/2007 Document Revised: 03/25/2018 Document Reviewed: 03/25/2018 Elsevier Patient Education  [employer].

## 2018-12-02 NOTE — Progress Notes (Signed)
I reviewed note and agree with plan.   Penni Bombard, MD 79/48/0165, 5:37 AM Certified in Neurology, Neurophysiology and Neuroimaging  [facility] [address] [place], [place] [address] [phone]

## 2018-12-13 ENCOUNTER — Encounter (HOSPITAL_COMMUNITY): Payer: Self-pay | Admitting: Emergency Medicine

## 2018-12-13 ENCOUNTER — Other Ambulatory Visit: Payer: Self-pay

## 2018-12-13 ENCOUNTER — Emergency Department (HOSPITAL_COMMUNITY)
Admission: EM | Admit: 2018-12-13 | Discharge: 2018-12-13 | Disposition: A | Discharge status: Home / Self Care | Payer: Medicare Other | Attending: Emergency Medicine | Admitting: Emergency Medicine

## 2018-12-13 DIAGNOSIS — Z79899 Other long term (current) drug therapy: Secondary | ICD-10-CM | POA: Diagnosis not present

## 2018-12-13 DIAGNOSIS — Y92192 Bathroom in other specified residential institution as the place of occurrence of the external cause: Secondary | ICD-10-CM | POA: Insufficient documentation

## 2018-12-13 DIAGNOSIS — F840 Autistic disorder: Secondary | ICD-10-CM | POA: Insufficient documentation

## 2018-12-13 DIAGNOSIS — Y999 Unspecified external cause status: Secondary | ICD-10-CM | POA: Diagnosis not present

## 2018-12-13 DIAGNOSIS — R479 Unspecified speech disturbances: Secondary | ICD-10-CM | POA: Diagnosis not present

## 2018-12-13 DIAGNOSIS — Y939 Activity, unspecified: Secondary | ICD-10-CM | POA: Insufficient documentation

## 2018-12-13 DIAGNOSIS — X58XXXA Exposure to other specified factors, initial encounter: Secondary | ICD-10-CM | POA: Insufficient documentation

## 2018-12-13 DIAGNOSIS — S0101XA Laceration without foreign body of scalp, initial encounter: Secondary | ICD-10-CM | POA: Diagnosis present
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
## 2018-12-13 NOTE — ED Notes (Signed)
Patient's caregiver verbalized understanding of dc instructions, vss, ambulatory with nad.

## 2018-12-13 NOTE — ED Notes (Signed)
EMS provided numbers of contact people for patient:  Elmyra Ricks Nurse at group home [phone] Bobbi-call to come pick up when patient is discharged336 [phone]

## 2018-12-13 NOTE — ED Notes (Signed)
Group home staff arrived to ED, patient escorted to [facility] with this RN, EMT michelle and security.

## 2018-12-13 NOTE — ED Notes (Signed)
Bobbi called to pick patient up.

## 2018-12-13 NOTE — ED Notes (Signed)
Spoke with Elmyra Ricks (nurse at group home), discussed discharge instruction to keep wound clean and dry.

## 2018-12-13 NOTE — ED Notes (Signed)
Per Elmyra Ricks, patient's nurse, pt has his of ocd, autism and aggression, states he did not get his meds this am. Takes haldol po 5mg  TID.

## 2018-12-13 NOTE — Discharge Instructions (Addendum)
Clean wound with soap and water Return if worsening

## 2019-01-26 ENCOUNTER — Emergency Department (HOSPITAL_COMMUNITY)
Admission: EM | Admit: 2019-01-26 | Discharge: 2019-01-27 | Disposition: A | Discharge status: Home / Self Care | Payer: Medicare Other | Attending: Emergency Medicine | Admitting: Emergency Medicine

## 2019-01-26 ENCOUNTER — Ambulatory Visit (HOSPITAL_COMMUNITY): Admission: RE | Admit: 2019-01-26 | Payer: Medicare Other | Source: Home / Self Care | Admitting: Psychiatry

## 2019-01-26 DIAGNOSIS — Z20822 Contact with and (suspected) exposure to covid-19: Secondary | ICD-10-CM | POA: Diagnosis not present

## 2019-01-26 DIAGNOSIS — F72 Severe intellectual disabilities: Secondary | ICD-10-CM | POA: Insufficient documentation

## 2019-01-26 DIAGNOSIS — F840 Autistic disorder: Secondary | ICD-10-CM | POA: Diagnosis not present

## 2019-01-26 DIAGNOSIS — F6381 Intermittent explosive disorder: Secondary | ICD-10-CM | POA: Insufficient documentation

## 2019-01-26 DIAGNOSIS — Z79899 Other long term (current) drug therapy: Secondary | ICD-10-CM | POA: Diagnosis not present

## 2019-01-26 DIAGNOSIS — R4689 Other symptoms and signs involving appearance and behavior: Secondary | ICD-10-CM

## 2019-01-26 LAB — CBC WITH DIFFERENTIAL/PLATELET
Abs Immature Granulocytes: 0.08 10*3/uL — ABNORMAL HIGH (ref 0.00–0.07)
Basophils Absolute: 0 10*3/uL (ref 0.0–0.1)
Basophils Relative: 0 %
Eosinophils Absolute: 0 10*3/uL (ref 0.0–0.5)
Eosinophils Relative: 0 %
HCT: 47 % (ref 39.0–52.0)
Hemoglobin: 14.7 g/dL (ref 13.0–17.0)
Immature Granulocytes: 1 %
Lymphocytes Relative: 21 %
Lymphs Abs: 2.2 10*3/uL (ref 0.7–4.0)
MCH: 26.6 pg (ref 26.0–34.0)
MCHC: 31.3 g/dL (ref 30.0–36.0)
MCV: 85 fL (ref 80.0–100.0)
Monocytes Absolute: 0.8 10*3/uL (ref 0.1–1.0)
Monocytes Relative: 8 %
Neutro Abs: 7.3 10*3/uL (ref 1.7–7.7)
Neutrophils Relative %: 70 %
Platelets: 336 10*3/uL (ref 150–400)
RBC: 5.53 MIL/uL (ref 4.22–5.81)
RDW: 13.2 % (ref 11.5–15.5)
WBC: 10.3 10*3/uL (ref 4.0–10.5)
nRBC: 0 % (ref 0.0–0.2)

## 2019-01-26 LAB — COMPREHENSIVE METABOLIC PANEL
ALT: 68 U/L — ABNORMAL HIGH (ref 0–44)
AST: 35 U/L (ref 15–41)
Albumin: 5.1 g/dL — ABNORMAL HIGH (ref 3.5–5.0)
Alkaline Phosphatase: 119 U/L (ref 38–126)
Anion gap: 12 (ref 5–15)
BUN: 14 mg/dL (ref 6–20)
CO2: 29 mmol/L (ref 22–32)
Calcium: 9.7 mg/dL (ref 8.9–10.3)
Chloride: 99 mmol/L (ref 98–111)
Creatinine, Ser: 0.91 mg/dL (ref 0.61–1.24)
GFR calc Af Amer: >60 mL/min (ref >60)
GFR calc non Af Amer: >60 mL/min (ref >60)
Glucose, Bld: 110 mg/dL — ABNORMAL HIGH (ref 70–99)
Potassium: 3.8 mmol/L (ref 3.5–5.1)
Sodium: 140 mmol/L (ref 135–145)
Total Bilirubin: 0.5 mg/dL (ref 0.3–1.2)
Total Protein: 8.1 g/dL (ref 6.5–8.1)

## 2019-01-26 LAB — SALICYLATE LEVEL: Salicylate Lvl: <7 mg/dL — ABNORMAL LOW (ref 7.0–30.0)

## 2019-01-26 LAB — ACETAMINOPHEN LEVEL: Acetaminophen (Tylenol), Serum: <10 ug/mL — ABNORMAL LOW (ref 10–30)

## 2019-01-26 LAB — ETHANOL: Alcohol, Ethyl (B): <10 mg/dL (ref <10)

## 2019-01-26 MED ORDER — CLONIDINE HCL 0.1 MG PO TABS
0.2000 mg | ORAL_TABLET | Freq: Two times a day (BID) | ORAL | Status: DC
  Administered 2019-01-26 – 2019-01-27 (×2): 0.2 mg via ORAL
  Filled 2019-01-26 (×2): qty 2

## 2019-01-26 MED ORDER — ZIPRASIDONE MESYLATE 20 MG IM SOLR
10.0000 mg | Freq: Once | INTRAMUSCULAR | Status: AC
  Administered 2019-01-26: 10 mg via INTRAMUSCULAR
  Filled 2019-01-26: qty 20

## 2019-01-26 MED ORDER — STERILE WATER FOR INJECTION IJ SOLN
INTRAMUSCULAR | Status: AC
  Administered 2019-01-26: 10 mL
  Filled 2019-01-26: qty 10

## 2019-01-26 MED ORDER — BENZTROPINE MESYLATE 1 MG PO TABS
1.0000 mg | ORAL_TABLET | Freq: Every day | ORAL | Status: DC
  Administered 2019-01-27: 1 mg via ORAL
  Filled 2019-01-26: qty 1

## 2019-01-26 MED ORDER — ZIPRASIDONE HCL 20 MG PO CAPS
60.0000 mg | ORAL_CAPSULE | Freq: Two times a day (BID) | ORAL | Status: DC
  Administered 2019-01-27 (×2): 60 mg via ORAL
  Filled 2019-01-26 (×2): qty 3

## 2019-01-26 MED ORDER — ATOMOXETINE HCL 60 MG PO CAPS: 60.0000 mg | ORAL_CAPSULE | Freq: Every day | ORAL | Status: DC

## 2019-01-26 MED ORDER — HALOPERIDOL LACTATE 5 MG/ML IJ SOLN
5.0000 mg | Freq: Once | INTRAMUSCULAR | Status: AC
  Administered 2019-01-26: 5 mg via INTRAMUSCULAR
  Filled 2019-01-26: qty 1

## 2019-01-26 NOTE — BH Assessment (Signed)
Spoke with RN, Waynetta Sandy, to update with disposition and that [facility] SW is currently contacting pt's group home to coordinate discharge
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
## 2019-01-26 NOTE — ED Notes (Addendum)
Pt uncooperative with redirection.  Pt trying to leave at times.  Pt making loud noises. Pt swatting at staff. Pt hit head on wall x2, redirected.   PRN Geodon 10 mg IM given at 1841.

## 2019-01-26 NOTE — Progress Notes (Signed)
CSW received a call from disposition stating all steps have been taken to allow the pt's group home owner [name]) [phone] to provide a time of p/u for the pt and time for the group home owner to provide information on the group home's plan to p/u the pt and ultimately the owner [name] is refusing to speak to [facility] ED staff/disposition regarding pt's D/C plan as pt has been psychiatrically cleared.  CSW called the pt's group home owner [name]) [phone] and left a HIPPA-compliant request stating the need for the pt's group home owner to stay in communication with the [facility] ED staff in order to facilitate pt's transport, pick-up and the meeting of the group home's needs prior to p/up.  Group home was given some time to respond to the CSW by phone and [name] (owner) did not return the CSW's call for assistance with the pt's care/confirmation of exact pick up time.  CSW will continue to follow for D/C needs.  Dorothe Pea. Mitesh Rosendahl, LCSW, LCAS, CSI Transitions of Care Clinical Social Worker Care Coordination Department Ph: [phone]
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
## 2019-01-27 DIAGNOSIS — F840 Autistic disorder: Secondary | ICD-10-CM | POA: Diagnosis not present

## 2019-01-27 LAB — SARS CORONAVIRUS 2 (TAT 6-24 HRS): SARS Coronavirus 2: NEGATIVE

## 2019-01-27 MED ORDER — ZIPRASIDONE MESYLATE 20 MG IM SOLR: 10.0000 mg | INTRAMUSCULAR | Status: DC | PRN

## 2019-01-27 NOTE — Progress Notes (Addendum)
CSW received a call from pt's [facility] owner Miss Nedra Hai stating she is en route with an ETA of 5 minutes to p/u the pt.  CSW updated RN and updated registration that Miss Nedra Hai will alert registration when she arrives.  CSW will continue to follow for D/C needs.  Dorothe Pea. Iverson Sees, LCSW, LCAS, CSI Transitions of Care Clinical Social Worker Care Coordination Department Ph: [phone]

## 2019-01-27 NOTE — ED Notes (Signed)
Ms Nedra Hai called and stated "they had a situation" and asked if we could call a cab or the [facility] to bring patient home.  I told her that he was not capable of taking a cab and the [place] do not transport people home.  She stated that she would come "as soon as she could"

## 2019-01-27 NOTE — Progress Notes (Addendum)
11:50a CSW reached out to Ms. Lee about picking patient up. Ms. Nedra Hai reports they are still short staffed and they will not be able to pick patient up until 3 or 4pm. CSW will follow up with Ms. Nedra Hai to make sure patient is picked up at that time. CSW will continue to follow up.  10:01a CSW spoke with [name], RN at patient's group home and also listed as an emergency contact after being uanble to reach [facility] of the group home. Sarah put Aliene Beams, group home owner, on the phone. Ms. Nedra Hai stated that she was unaware of the situation with patient. Ms. Nedra Hai reports no one is able to pick patient up at 10/10:30 as expected as they are transporting other patients. Ms. Nedra Hai reports they will be able to pick patient up around 12p. CSW impressed on Ms. Nedra Hai that patient needs to be picked up ASAP with 12p being the lastest. Ms. Nedra Hai reports she will be in contact with patient. Ms. Nedra Hai can be reached out [phone]. CSW will continue to follow up.    Geralyn Corwin, LCSW Transitions of Care Department [facility] ED [phone]

## 2019-01-27 NOTE — ED Notes (Signed)
Pt discharged safely with Group home.  All belongings were returned to pt.

## 2019-01-27 NOTE — Progress Notes (Signed)
2nd shift ED CSW received a handoff from the 1st shift [facility] ED CSW.   CSW called, "Aliene Beams, group home owner" at ph: [phone] and requested that the pt's pick-up time be clarified as there have been 4 documented instances of the [facility] ED being told that the pt is to be picked up by the group home and four documented instances of the pt not being picked up at the previously stated times and that this could constitute in the eyes of [place] specifically DSS/APS, and in the eyes of the hospital as abandonment and protocol would require the CSW to immediately follow up with APS and.or DHHS as such and Miss Nedra Hai immediately stated the group home would be at [facility] ED at 6pm.  RN updated.  CSW will continue to follow for D/C needs.  Dorothe Pea. Jorgina Binning, LCSW, LCAS, CSI Transitions of Care Clinical Social Worker Care Coordination Department Ph: [phone]

## 2019-01-27 NOTE — ED Notes (Signed)
Pt standing around the door, not wanting to stay inside of the room. Pt. Given saltine crackers and apple juice, in order to stay within the room. Pt., now sitting in the room, eating their saltine crackers.

## 2019-01-27 NOTE — ED Notes (Signed)
Pt.s breakfast has arrived. Currently sitting on the side of his bed.

## 2019-01-27 NOTE — ED Notes (Signed)
Writer contacted Nicholas Shepherd from patient's group home at [phone].  Ms. Nedra Hai stated they would be here to pick patient up between 3pm and 4pm when they have staff available.

## 2019-02-01 ENCOUNTER — Ambulatory Visit (HOSPITAL_COMMUNITY)
Admission: RE | Admit: 2019-02-01 | Discharge: 2019-02-01 | Disposition: A | Discharge status: Home / Self Care | Payer: Medicare Other | Source: Home / Self Care | Attending: Psychiatry | Admitting: Psychiatry

## 2019-02-01 ENCOUNTER — Other Ambulatory Visit: Payer: Self-pay

## 2019-02-01 ENCOUNTER — Emergency Department (HOSPITAL_COMMUNITY)
Admission: EM | Admit: 2019-02-01 | Discharge: 2019-02-05 | Disposition: A | Discharge status: Home / Self Care | Payer: Medicare Other | Attending: Emergency Medicine | Admitting: Emergency Medicine

## 2019-02-01 DIAGNOSIS — F329 Major depressive disorder, single episode, unspecified: Secondary | ICD-10-CM | POA: Diagnosis not present

## 2019-02-01 DIAGNOSIS — F3481 Disruptive mood dysregulation disorder: Secondary | ICD-10-CM | POA: Diagnosis present

## 2019-02-01 DIAGNOSIS — G40909 Epilepsy, unspecified, not intractable, without status epilepticus: Secondary | ICD-10-CM

## 2019-02-01 DIAGNOSIS — F840 Autistic disorder: Secondary | ICD-10-CM | POA: Diagnosis not present

## 2019-02-01 DIAGNOSIS — R456 Violent behavior: Secondary | ICD-10-CM | POA: Diagnosis not present

## 2019-02-01 DIAGNOSIS — Z046 Encounter for general psychiatric examination, requested by authority: Secondary | ICD-10-CM | POA: Diagnosis present

## 2019-02-01 DIAGNOSIS — Z20822 Contact with and (suspected) exposure to covid-19: Secondary | ICD-10-CM | POA: Insufficient documentation

## 2019-02-01 DIAGNOSIS — R4689 Other symptoms and signs involving appearance and behavior: Secondary | ICD-10-CM | POA: Diagnosis present

## 2019-02-01 DIAGNOSIS — F79 Unspecified intellectual disabilities: Secondary | ICD-10-CM

## 2019-02-01 LAB — COMPREHENSIVE METABOLIC PANEL
ALT: 41 U/L (ref 0–44)
AST: 29 U/L (ref 15–41)
Albumin: 4.7 g/dL (ref 3.5–5.0)
Alkaline Phosphatase: 115 U/L (ref 38–126)
Anion gap: 13 (ref 5–15)
BUN: 9 mg/dL (ref 6–20)
CO2: 24 mmol/L (ref 22–32)
Calcium: 9.9 mg/dL (ref 8.9–10.3)
Chloride: 101 mmol/L (ref 98–111)
Creatinine, Ser: 0.97 mg/dL (ref 0.61–1.24)
GFR calc Af Amer: >60 mL/min (ref >60)
GFR calc non Af Amer: >60 mL/min (ref >60)
Glucose, Bld: 118 mg/dL — ABNORMAL HIGH (ref 70–99)
Potassium: 4.1 mmol/L (ref 3.5–5.1)
Sodium: 138 mmol/L (ref 135–145)
Total Bilirubin: 0.4 mg/dL (ref 0.3–1.2)
Total Protein: 7.6 g/dL (ref 6.5–8.1)

## 2019-02-01 LAB — ETHANOL: Alcohol, Ethyl (B): <10 mg/dL (ref <10)

## 2019-02-01 LAB — RESPIRATORY PANEL BY RT PCR (FLU A&B, COVID)
Influenza A by PCR: NEGATIVE
Influenza B by PCR: NEGATIVE
SARS Coronavirus 2 by RT PCR: NEGATIVE

## 2019-02-01 MED ORDER — ZOLPIDEM TARTRATE 5 MG PO TABS
5.0000 mg | ORAL_TABLET | Freq: Every evening | ORAL | Status: DC | PRN
  Administered 2019-02-01 – 2019-02-02 (×2): 5 mg via ORAL
  Filled 2019-02-01 (×2): qty 1

## 2019-02-01 MED ORDER — LAMOTRIGINE 100 MG PO TABS
100.0000 mg | ORAL_TABLET | Freq: Two times a day (BID) | ORAL | Status: DC
  Administered 2019-02-01: 100 mg via ORAL
  Filled 2019-02-01: qty 1

## 2019-02-01 MED ORDER — SODIUM CHLORIDE (PF) 0.9 % IJ SOLN
INTRAMUSCULAR | Status: AC
  Filled 2019-02-01: qty 10

## 2019-02-01 MED ORDER — OLANZAPINE 5 MG PO TBDP
5.0000 mg | ORAL_TABLET | Freq: Three times a day (TID) | ORAL | Status: DC | PRN
  Administered 2019-02-01 – 2019-02-03 (×3): 5 mg via ORAL
  Filled 2019-02-01 (×3): qty 1

## 2019-02-01 MED ORDER — ALUM & MAG HYDROXIDE-SIMETH 200-200-20 MG/5ML PO SUSP: 30.0000 mL | Freq: Four times a day (QID) | ORAL | Status: DC | PRN

## 2019-02-01 MED ORDER — NICOTINE 21 MG/24HR TD PT24
21.0000 mg | MEDICATED_PATCH | Freq: Every day | TRANSDERMAL | Status: DC
  Filled 2019-02-01: qty 1

## 2019-02-01 MED ORDER — ZIPRASIDONE MESYLATE 20 MG IM SOLR: 20.0000 mg | INTRAMUSCULAR | Status: DC | PRN

## 2019-02-01 MED ORDER — ONDANSETRON HCL 4 MG PO TABS: 4.0000 mg | ORAL_TABLET | Freq: Three times a day (TID) | ORAL | Status: DC | PRN

## 2019-02-01 MED ORDER — ZIPRASIDONE MESYLATE 20 MG IM SOLR
10.0000 mg | Freq: Once | INTRAMUSCULAR | Status: AC
  Administered 2019-02-01: 10 mg via INTRAMUSCULAR
  Filled 2019-02-01: qty 20

## 2019-02-01 MED ORDER — BENZTROPINE MESYLATE 1 MG PO TABS
1.0000 mg | ORAL_TABLET | Freq: Every day | ORAL | Status: DC
  Administered 2019-02-01 – 2019-02-05 (×5): 1 mg via ORAL
  Filled 2019-02-01 (×5): qty 1

## 2019-02-01 MED ORDER — CLONIDINE HCL 0.1 MG PO TABS
0.2000 mg | ORAL_TABLET | Freq: Two times a day (BID) | ORAL | Status: DC
  Administered 2019-02-01 – 2019-02-05 (×8): 0.2 mg via ORAL
  Filled 2019-02-01 (×8): qty 2

## 2019-02-01 MED ORDER — ACETAMINOPHEN 325 MG PO TABS: 650.0000 mg | ORAL_TABLET | ORAL | Status: DC | PRN

## 2019-02-01 MED ORDER — LORAZEPAM 1 MG PO TABS: 1.0000 mg | ORAL_TABLET | ORAL | Status: DC | PRN

## 2019-02-01 MED ORDER — ZIPRASIDONE HCL 20 MG PO CAPS
60.0000 mg | ORAL_CAPSULE | Freq: Two times a day (BID) | ORAL | Status: DC
  Administered 2019-02-01 – 2019-02-05 (×8): 60 mg via ORAL
  Filled 2019-02-01 (×8): qty 3

## 2019-02-01 MED ORDER — QUETIAPINE FUMARATE 100 MG PO TABS
100.0000 mg | ORAL_TABLET | Freq: Every day | ORAL | Status: DC
  Administered 2019-02-01: 100 mg via ORAL
  Filled 2019-02-01: qty 1

## 2019-02-01 MED ORDER — HALOPERIDOL 5 MG PO TABS
5.0000 mg | ORAL_TABLET | Freq: Two times a day (BID) | ORAL | Status: DC
  Administered 2019-02-01: 5 mg via ORAL
  Filled 2019-02-01: qty 1

## 2019-02-01 MED ORDER — CLOMIPRAMINE HCL 25 MG PO CAPS
100.0000 mg | ORAL_CAPSULE | Freq: Two times a day (BID) | ORAL | Status: DC
  Administered 2019-02-01 – 2019-02-05 (×8): 100 mg via ORAL
  Filled 2019-02-01 (×9): qty 4
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
## 2019-02-01 NOTE — ED Notes (Signed)
Lab called and stated that lavender tube had clotted.  Need to redraw.

## 2019-02-01 NOTE — ED Notes (Signed)
Group home nurse left her contact number Maralyn Sago [phone].  She is hoping that patient will get a medication adjustment.
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
## 2019-02-02 DIAGNOSIS — F840 Autistic disorder: Secondary | ICD-10-CM | POA: Diagnosis not present

## 2019-02-02 LAB — BASIC METABOLIC PANEL
Anion gap: 19 — ABNORMAL HIGH (ref 5–15)
BUN: 11 mg/dL (ref 6–20)
CO2: 19 mmol/L — ABNORMAL LOW (ref 22–32)
Calcium: 9.6 mg/dL (ref 8.9–10.3)
Chloride: 101 mmol/L (ref 98–111)
Creatinine, Ser: 1.32 mg/dL — ABNORMAL HIGH (ref 0.61–1.24)
GFR calc Af Amer: >60 mL/min (ref >60)
GFR calc non Af Amer: >60 mL/min (ref >60)
Glucose, Bld: 109 mg/dL — ABNORMAL HIGH (ref 70–99)
Potassium: 5.2 mmol/L — ABNORMAL HIGH (ref 3.5–5.1)
Sodium: 139 mmol/L (ref 135–145)

## 2019-02-02 LAB — MAGNESIUM: Magnesium: 2.4 mg/dL (ref 1.7–2.4)

## 2019-02-02 MED ORDER — HALOPERIDOL 5 MG PO TABS
5.0000 mg | ORAL_TABLET | Freq: Every day | ORAL | Status: DC
  Administered 2019-02-02 – 2019-02-05 (×4): 5 mg via ORAL
  Filled 2019-02-02 (×4): qty 1

## 2019-02-02 MED ORDER — HALOPERIDOL 5 MG PO TABS
10.0000 mg | ORAL_TABLET | Freq: Every day | ORAL | Status: DC
  Administered 2019-02-02 – 2019-02-04 (×2): 10 mg via ORAL
  Filled 2019-02-02 (×3): qty 2

## 2019-02-02 MED ORDER — LAMOTRIGINE 100 MG PO TABS
100.0000 mg | ORAL_TABLET | Freq: Two times a day (BID) | ORAL | Status: DC
  Administered 2019-02-02 – 2019-02-05 (×6): 100 mg via ORAL
  Filled 2019-02-02 (×6): qty 1

## 2019-02-02 MED ORDER — OXCARBAZEPINE 150 MG PO TABS
150.0000 mg | ORAL_TABLET | Freq: Two times a day (BID) | ORAL | Status: DC
  Administered 2019-02-02 – 2019-02-05 (×6): 150 mg via ORAL
  Filled 2019-02-02 (×6): qty 1

## 2019-02-02 MED ORDER — HALOPERIDOL LACTATE 5 MG/ML IJ SOLN
10.0000 mg | Freq: Once | INTRAMUSCULAR | Status: AC
  Administered 2019-02-02: 10 mg via INTRAMUSCULAR
  Filled 2019-02-02: qty 2

## 2019-02-02 MED ORDER — LORAZEPAM 2 MG/ML IJ SOLN
1.0000 mg | Freq: Once | INTRAMUSCULAR | Status: AC
  Administered 2019-02-02: 1 mg via INTRAVENOUS
  Filled 2019-02-02: qty 1

## 2019-02-02 MED ORDER — LORAZEPAM 2 MG/ML IJ SOLN
INTRAMUSCULAR | Status: AC
  Filled 2019-02-02: qty 1

## 2019-02-02 NOTE — Progress Notes (Signed)
Pt woke up at 0545 hrs and continued to previous aggressive behavior. He was medicated with Zyprexa 5 mg PO. The sitter remains at the bedside.

## 2019-02-02 NOTE — Progress Notes (Signed)
Pt meets inpatient criteria. Referral information has been sent to the following hospitals for review:  [facility]  [facility]           Disposition will continue to assist with inpatient placement needs.   Wells Guiles, LCSW, LCAS Disposition CSW [facility]/TTS [phone]

## 2019-02-02 NOTE — ED Provider Notes (Addendum)
Nurses called for evaluation the patient in the room for seizure.  Patient has been acting out today and hard to redirect.  Medications were being administered this evening.  Patient was in his room getting his vital signs taken.  Just after he took his medications, he fell back into the bed and had seizure-like activity.  He did not have any injury or fall in the floor.  I entered the room to find the patient lying on his right side on the bed.  He was having some low amplitude tonic-clonic motion and some oral masticating motions.  Airway was intact.  Oxygen saturation was high 90s.  As I was in the room this was starting to slow and resolved.  IV was established.  Ativan 1 mg given.  Patient transferred over to the emergency department.  Review of EMR indicates patient does have history of seizure disorder.  He has been on lamotrigine as his primary seizure medication.  This was discontinued today thus he did not have a morning dose.  At this time I suspect this is a breakthrough seizure.  He is otherwise been functioning at what is his baseline.  Baseline has been agitated and difficult to direct with nonverbal autism.  Patient was postictal for about an hour or 2 but then began to wake up again.  He started to remove his monitors and has been looking around.  His nurse reports that he was compliant with taking his Lamictal as ordered.  He was given 1 additional milligram of Ativan prophylactically at the same time.  Plan at this time will be to hopefully have the patient rest through much of the night and when awakens again, should be stable for transfer back to psych unit.  Lamictal has been reordered as a regular medication.  Patient will need this for seizure control unless alternate plan can be made with neurology that psychiatry feels is better for behavior control.  He will however obviously have to have management of seizure disorder with medications.   I did talk to Dr. Jerrell Belfast regarding how to  manage breakthrough seizure and his recommendation was to restart the lamotrigine at this time and use Ativan as needed.   23: 10 patient is now fully awake.  He is standing in the room and is taking his shirt off.  He is repeatedly wanting to walk out of the room.  Staff and security are at his side and trying to redirect to sit down or get in his stretcher.  Patient is not willing to do so.  He is not being physically aggressive at this time but is repeatedly trying to physically walk out of his room.  Will administer Haldol 10 mg IM. Arby Barrette, MD 02/02/19 2256    Arby Barrette, MD 02/02/19 2316    Arby Barrette, MD 02/03/19 2012

## 2019-02-02 NOTE — BH Assessment (Addendum)
Completed Diversion paperwork for patient's referral to [facility]. Contacted the [facility] to obtain authorizations to complete the diversion paperwork. Spoke to [facility] coordinator [facility]) to request authorizations and she stated that the diversion paperwork is not eligible to be submitted at this time. Must wait 72 hours from the point of patient's admission to the hospital before submitting. Once patient has been in the hospital for the 72 hours it is then ok to submit/fax documents to [facility]. The fax # is [phone]. (Please send 3 days/72 hours worth of nursing notes) after 72 hours of remaining in the ED.

## 2019-02-02 NOTE — ED Notes (Signed)
Returned to room 30 for continued monitoring now that he has been stabilized in ED. He has had fluids and meds to prevent seizures. He no longer has an IV in. He has had an additional dose of Haldol per order. He is calm at this moment. He has been given a snack.

## 2019-02-02 NOTE — ED Notes (Signed)
Ran out of room. Sitter present but he ran past her. He circled the unit, threw something away and returned to his room. He did something similar to that a second time. Gave him coloring pages and crayons but seems disinterested in it.

## 2019-02-02 NOTE — ED Notes (Signed)
Patient has been running out of room into nurses station.  He is impossible to redirect.  We have had security here all morning in addition to our regular staff and sitter.

## 2019-02-02 NOTE — ED Notes (Signed)
Giving him his HS meds and trying to get HS set of vitals. He stood at the sink for sometime making throat noises but not responding to staffs direction to sit on bed so I could take his vitals and give his HS meds. After flushing some paper towels down the toilet he did allow his vitals to be taken but not temp, just bit down on the probe. Took his meds but prior to taking them three times put his hands in writers pockets I suspect to get graham crackers I had in there for him after he took his meds. I gave him a prn of Ambien along with his HS meds. Seconds after taking his meds he had seizure like activity. He fell back into the bed, cup of water he had in his hand fell to floor and he made throat noises, not responsive to writer clonic movements, and it lasted about three minutes. Code called. RN staff put a line in, Dr Donnald Garre present and ordered 1 mg of Ativan, given by the charge nurse. He does not verbalize. He has a hx of seizures. Moved to room 11 on ED side for continued monitoring and treatment.

## 2019-02-02 NOTE — Progress Notes (Signed)
CSW received called from Care Coordinator [facility] with [facility] [phone]). Marcelino Duster reports that they are working to get patient into the [facility] in [place], [place]. Marcelino Duster reports this process can take 1-2 weeks, but she is trying to get in expedited with understanding that patient cannot be held in the ED. Marcelino Duster stated she hopes patient will not have to return to his group home and can [facility] straight [facility]. CSW explained that if patient is psych cleared we will have to have him return to his group home. Marcelino Duster stated she understood and will continue work on finding permanent placement for patient. Per chart review, TTS is seeking inpatient psych for patient.  Geralyn Corwin, LCSW Transitions of Care Department [facility] ED [phone]

## 2019-02-02 NOTE — Progress Notes (Signed)
Patient ID: Nicholas Shepherd, male   DOB: [birth year], 19 y.o.   MRN: [id number]   Chart reviewed; patient seen for face to face and reviewed with Lucianne Muss.  He is non-verbal but seen running out of his room. Collateral information received from nursing the patient has been agitated, frequently out of his room and not easily redirected.  He received a prn Geodon earlier today, which did not seem to help his agitation much.  Patient continues to meet the criteria for inpatient psychiatric care and would benefit from medication adjustment to assist with mood stabilization.     Treatment Plan:  Recommend inpatient.   SW seeking placement.   The following medications changes are made: Discontinue the following: Seroquel 100mg  qhs Lamotrigine 100mg  qhs Haldol 5mg  BID  Complete and EKG, if okay   Start: Haldol 5mg  am and 10mg  qhs Oxcarbazepine 150mg  BID for mood stabilization

## 2019-02-03 DIAGNOSIS — F840 Autistic disorder: Secondary | ICD-10-CM | POA: Diagnosis not present

## 2019-02-03 DIAGNOSIS — G40909 Epilepsy, unspecified, not intractable, without status epilepticus: Secondary | ICD-10-CM | POA: Insufficient documentation

## 2019-02-03 MED ORDER — STERILE WATER FOR INJECTION IJ SOLN
INTRAMUSCULAR | Status: AC
  Administered 2019-02-03: 10 mL
  Filled 2019-02-03: qty 10

## 2019-02-03 MED ORDER — LORAZEPAM 2 MG/ML IJ SOLN
INTRAMUSCULAR | Status: AC
  Filled 2019-02-03: qty 1

## 2019-02-03 MED ORDER — LORAZEPAM 2 MG/ML IJ SOLN
2.0000 mg | Freq: Once | INTRAMUSCULAR | Status: AC
  Administered 2019-02-03: 2 mg via INTRAMUSCULAR

## 2019-02-03 MED ORDER — HALOPERIDOL LACTATE 5 MG/ML IJ SOLN: 5.0000 mg | Freq: Once | INTRAMUSCULAR | Status: DC

## 2019-02-03 MED ORDER — ZIPRASIDONE MESYLATE 20 MG IM SOLR
INTRAMUSCULAR | Status: AC
  Filled 2019-02-03: qty 20

## 2019-02-03 NOTE — Progress Notes (Signed)
CSW spoke with patient's mother/legal guardian, Nicholas Shepherd, to provide her an update on patient. CSW stated that at this time, patient is still under the TTS and they are trying to find him impatient psych placement. CSW explained the process if patient is psych cleared. Nicholas Shepherd reports she understands that if patient is cleared by psych, he will have to go back to the group home. CSW informed Nicholas Shepherd that a nurse Maralyn Sago has called the ED and stated that she will need to come evaluate patient if he has to come back. Nicholas Shepherd was agreeable to this. Nicholas Shepherd also asked if CSW could speak with Nicholas Shepherd with [facility] again. CSW spoke with Nicholas Shepherd who reports they are still working on getting [facility] into the [facility] [place], but it still will take some time. CSW will continue to follow up with TTS and patient's mother.  Geralyn Corwin, LCSW Transitions of Care Department [facility] ED [phone]

## 2019-02-03 NOTE — Progress Notes (Signed)
CSW spoke with the nurse of the group home, Maralyn Sago, who reports she needs to do an evaluation on patient before patient can return. Maralyn Sago reports she has already spoken with Inetta Fermo, NP with [facility] and know that patient will be evaluated overnight and will likely be discharged in the morning. Maralyn Sago reports she will be at the hospital between 5 and 6 to do the evaluation. CSW informed RN. CSW will continue to follow up for discharge needs.   Geralyn Corwin, LCSW Transitions of Care Department [facility] ED [phone]
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
## 2019-02-03 NOTE — ED Notes (Signed)
Slept an hour more after taking the Zyprexa and then awake and again trotting around the nurses station and ends up at the hand sani station and fills hands with it and returns to his room. Does not follow staffs direction to return to his room. Back out to nurses station looking like he wanted to throw paper towels out but instead he tore the papers that were taped to the cabinet doors off.

## 2019-02-03 NOTE — ED Notes (Signed)
Woke up and ran around the unit, trying to open drawers, put a lot of hand sanitizer on his hands, not following staffs direction. He did return to his room. Security called but when they got here he was back in his room. PRN available is 5mg  of Zyprexa Zydis. He did take it and was given two graham crackers after taking his med. He does not verbalize and does not follow directions, impulsive and earlier tonight while in ED status post seizure had meds to sedate him and he slept approx 2.5 hours.  Security standing present with him at this time.

## 2019-02-03 NOTE — ED Notes (Signed)
Pt. Woke up and their dinner tray was given to them.

## 2019-02-03 NOTE — ED Notes (Signed)
Pt. Given 2 crackers and apple juice.

## 2019-02-03 NOTE — Consult Note (Signed)
Telepsych Consultation   Reason for Consult:  Behavioral disturbance Referring Physician: Elvina Sidle, EDP Location of Patient: [facility] emergency department Location of Provider: [facility]  Patient Identification: Nicholas Shepherd MRN:  [id number] Principal Diagnosis: Aggressive behavior Diagnosis:  Principal Problem:   Aggressive behavior Active Problems:   DMDD (disruptive mood dysregulation disorder) ([place])   Autism spectrum disorder   Intellectual disability   Total Time spent with patient: 30 minutes  Subjective:   Nicholas Shepherd is a 19 y.o. male patient admitted with aggressive behavior with group home staff.  Patient assessed by nurse practitioner.  Patient nonverbal, visualized seated on side of the bed.  Patient behavior appears appropriate during telepsychiatry visit. Per RN patient behavior improved today.  Per RN 1 instance of patient attempting to leave room this shift. Registered nurse from patient's group home plans to evaluate patient today to assess behavior in anticipation of discharge with return to group home. Patient seen along with Dr. Dwyane Dee.  Plan to reassess patient for possible discharge back to group home on tomorrow 02/04/2019.  HPI: Patient admitted with aggressive behavior at group home.  Past Psychiatric History: Intellectual disability, disruptive mood dysregulation disorder, autism spectrum disorder  Risk to Self: Suicidal Ideation: No Suicidal Intent: No Is patient at risk for suicide?: No Suicidal Plan?: No Access to Means: No What has been your use of drugs/alcohol within the last 12 months?: none How many times?: 0 Other Self Harm Risks: none Triggers for Past Attempts: None known Intentional Self Injurious Behavior: None Risk to Others: Homicidal Ideation: No Thoughts of Harm to Others: No Current Homicidal Intent: No Current Homicidal Plan: No Access to Homicidal Means: No Identified Victim: none History of harm  to others?: Yes Assessment of Violence: In past 6-12 months Violent Behavior Description: (increasingly aggressive towards group home staff) Does patient have access to weapons?: No Criminal Charges Pending?: No Does patient have a court date: No Prior Inpatient Therapy: Prior Inpatient Therapy: ([facility]) Prior Outpatient Therapy: Prior Outpatient Therapy: Yes Prior Therapy Dates: Active Prior Therapy Facilty/Provider(s): [facility] Reason for Treatment: autism/behavior management Does patient have an ACCT team?: No Does patient have Intensive In-House Services?  : No Does patient have [facility] services? : No Does patient have P4CC services?: No  Past Medical History:  Past Medical History:  Diagnosis Date  . Anxiety   . Autism disorder    severe per legal gaurdian  . Depression   . Nonverbal   . Otitis media   . Seizures ([place]) 11/09/2017, 03/2018   most recent seizure activity on 04/28/18 x 4 minutes   No past surgical history on file. Family History:  Family History  Problem Relation Age of Onset  . Seizures Mother        epilepsy as a child   Family Psychiatric  History: Unknown Social History:  Social History   Substance and Sexual Activity  Alcohol Use No     Social History   Substance and Sexual Activity  Drug Use No    Social History   Socioeconomic History  . Marital status: Single    Spouse name: Not on file  . Number of children: 0  . Years of education: 23  . Highest education level: Not on file  Occupational History    Comment: NA  Tobacco Use  . Smoking status: Never Smoker  . Smokeless tobacco: Never Used  Substance and Sexual Activity  . Alcohol use: No  . Drug use: No  .  Sexual activity: Never  Other Topics Concern  . Not on file  Social History Narrative   Lives with mother, legal guardian   08[facility]02[facility]   No caffeine   Social Determinants of Health   Financial Resource Strain:   . Difficulty of  Paying Living Expenses: Not on file  Food Insecurity:   . Worried About Programme researcher, broadcasting/film/videounning Out of Food in the Last Year: Not on file  . Ran Out of Food in the Last Year: Not on file  Transportation Needs:   . Lack of Transportation (Medical): Not on file  . Lack of Transportation (Non-Medical): Not on file  Physical Activity:   . Days of Exercise per Week: Not on file  . Minutes of Exercise per Session: Not on file  Stress:   . Feeling of Stress : Not on file  Social Connections:   . Frequency of Communication with Friends and Family: Not on file  . Frequency of Social Gatherings with Friends and Family: Not on file  . Attends Religious Services: Not on file  . Active Member of Clubs or Organizations: Not on file  . Attends BankerClub or Organization Meetings: Not on file  . Marital Status: Not on file   Additional Social History:    Allergies:   Allergies  Allergen Reactions  . Ativan [Lorazepam] Other (See Comments)    aggression  . Depakote [Divalproex Sodium] Other (See Comments)    Made patient angry and his behavioral issues worsened   . Vyvanse [Lisdexamfetamine]     Labs:  Results for orders placed or performed during the hospital encounter of 02/01/19 (from the past 48 hour(s))  Comprehensive metabolic panel     Status: Abnormal   Collection Time: 02/01/19  4:38 PM  Result Value Ref Range   Sodium 138 135 - 145 mmol/L   Potassium 4.1 3.5 - 5.1 mmol/L   Chloride 101 98 - 111 mmol/L   CO2 24 22 - 32 mmol/L   Glucose, Bld 118 (H) 70 - 99 mg/dL   BUN 9 6 - 20 mg/dL   Creatinine, Ser 5.400.97 0.61 - 1.24 mg/dL   Calcium 9.9 8.9 - 98.110.3 mg/dL   Total Protein 7.6 6.5 - 8.1 g/dL   Albumin 4.7 3.5 - 5.0 g/dL   AST 29 15 - 41 U/L   ALT 41 0 - 44 U/L   Alkaline Phosphatase 115 38 - 126 U/L   Total Bilirubin 0.4 0.3 - 1.2 mg/dL   GFR calc non Af Amer >60 >60 mL/min   GFR calc Af Amer >60 >60 mL/min   Anion gap 13 5 - 15    Comment: Performed at [facility], [address] [place], [place] [address]  Ethanol     Status: None   Collection Time: 02/01/19  4:38 PM  Result Value Ref Range   Alcohol, Ethyl (B) <10 <10 mg/dL    Comment: (NOTE) Lowest detectable limit for serum alcohol is 10 mg/dL. For medical purposes only. Performed at [facility], [address] [place], [place] [address]   Respiratory Panel by RT PCR (Flu A&B, Covid) - Nasopharyngeal Swab     Status: None   Collection Time: 02/01/19  8:16 PM   Specimen: Nasopharyngeal Swab  Result Value Ref Range   SARS Coronavirus 2 by RT PCR NEGATIVE NEGATIVE    Comment: (NOTE) SARS-CoV-2 target nucleic acids are NOT DETECTED. The SARS-CoV-2 RNA is generally detectable in upper respiratoy  specimens during the acute phase of infection. The lowest concentration of SARS-CoV-2 viral copies this assay can detect is 131 copies/mL. A negative result does not preclude SARS-Cov-2 infection and should not be used as the sole basis for treatment or other patient management decisions. A negative result may occur with  improper specimen collection/handling, submission of specimen other than nasopharyngeal swab, presence of viral mutation(s) within the areas targeted by this assay, and inadequate number of viral copies (<131 copies/mL). A negative result must be combined with clinical observations, patient history, and epidemiological information. The expected result is Negative. Fact Sheet for Patients:  https://www.moore.com/ Fact Sheet for Healthcare Providers:  https://www.young.biz/ This test is not yet ap proved or cleared by the [place] FDA and  has been authorized for detection and/or diagnosis of SARS-CoV-2 by FDA under an Emergency Use Authorization (EUA). This EUA will remain  in effect (meaning this test can be used) for the duration of the COVID-19 declaration under Section 564(b)(1) of the Act, 21 U.S.C. section  360bbb-3(b)(1), unless the authorization is terminated or revoked sooner.    Influenza A by PCR NEGATIVE NEGATIVE   Influenza B by PCR NEGATIVE NEGATIVE    Comment: (NOTE) The Xpert Xpress SARS-CoV-2/FLU/RSV assay is intended as an aid in  the diagnosis of influenza from Nasopharyngeal swab specimens and  should not be used as a sole basis for treatment. Nasal washings and  aspirates are unacceptable for Xpert Xpress SARS-CoV-2/FLU/RSV  testing. Fact Sheet for Patients: https://www.moore.com/ Fact Sheet for Healthcare Providers: https://www.young.biz/ This test is not yet approved or cleared by the [place] FDA and  has been authorized for detection and/or diagnosis of SARS-CoV-2 by  FDA under an Emergency Use Authorization (EUA). This EUA will remain  in effect (meaning this test can be used) for the duration of the  Covid-19 declaration under Section 564(b)(1) of the Act, 21  U.S.C. section 360bbb-3(b)(1), unless the authorization is  terminated or revoked. Performed at [facility], [address], [place], [place] [address]   Basic metabolic panel     Status: Abnormal   Collection Time: 02/02/19  9:30 PM  Result Value Ref Range   Sodium 139 135 - 145 mmol/L   Potassium 5.2 (H) 3.5 - 5.1 mmol/L    Comment: DELTA CHECK NOTED   Chloride 101 98 - 111 mmol/L   CO2 19 (L) 22 - 32 mmol/L   Glucose, Bld 109 (H) 70 - 99 mg/dL   BUN 11 6 - 20 mg/dL   Creatinine, Ser 6.04 (H) 0.61 - 1.24 mg/dL   Calcium 9.6 8.9 - 54.0 mg/dL   GFR calc non Af Amer >60 >60 mL/min   GFR calc Af Amer >60 >60 mL/min   Anion gap 19 (H) 5 - 15    Comment: Performed at [facility], [address], [place], [place] [address]  Magnesium     Status: None   Collection Time: 02/02/19  9:30 PM  Result Value Ref Range   Magnesium 2.4 1.7 - 2.4 mg/dL    Comment: Performed at [facility], [address],  [place], [place] [address]    Medications:  Current Facility-Administered Medications  Medication Dose Route Frequency Provider Last Rate Last Admin  . acetaminophen (TYLENOL) tablet 650 mg  650 mg Oral Q4H PRN Fayrene Helper, PA-C      . alum & mag hydroxide-simeth (MAALOX/MYLANTA) 200-200-20 MG/5ML suspension 30 mL  30 mL Oral Q6H PRN Fayrene Helper,  PA-C      . benztropine (COGENTIN) tablet 1 mg  1 mg Oral Daily Fayrene Helper, PA-C   1 mg at 02/03/19 2952  . clomiPRAMINE (ANAFRANIL) capsule 100 mg  100 mg Oral BID Fayrene Helper, PA-C   100 mg at 02/03/19 8413  . cloNIDine (CATAPRES) tablet 0.2 mg  0.2 mg Oral BID Fayrene Helper, PA-C   0.2 mg at 02/03/19 2440  . haloperidol (HALDOL) tablet 10 mg  10 mg Oral QHS Ophelia Shoulder E, NP   10 mg at 02/02/19 2108  . haloperidol (HALDOL) tablet 5 mg  5 mg Oral Daily Ophelia Shoulder E, NP   5 mg at 02/03/19 1027  . lamoTRIgine (LAMICTAL) tablet 100 mg  100 mg Oral BID Arby Barrette, MD   100 mg at 02/03/19 0927  . LORazepam (ATIVAN) 2 MG/ML injection           . nicotine (NICODERM CQ - dosed in mg/24 hours) patch 21 mg  21 mg Transdermal Daily Fayrene Helper, PA-C      . OLANZapine zydis (ZYPREXA) disintegrating tablet 5 mg  5 mg Oral Q8H PRN Fayrene Helper, PA-C   5 mg at 02/03/19 0406  . ondansetron (ZOFRAN) tablet 4 mg  4 mg Oral Q8H PRN Fayrene Helper, PA-C      . OXcarbazepine (TRILEPTAL) tablet 150 mg  150 mg Oral BID Ophelia Shoulder E, NP   150 mg at 02/03/19 0927  . ziprasidone (GEODON) capsule 60 mg  60 mg Oral BID WC Fayrene Helper, PA-C   60 mg at 02/03/19 0748  . zolpidem (AMBIEN) tablet 5 mg  5 mg Oral QHS PRN Fayrene Helper, PA-C   5 mg at 02/02/19 2108   Current Outpatient Medications  Medication Sig Dispense Refill  . alum & mag hydroxide-simeth (MAALOX PLUS) 400-400-40 MG/5ML suspension Take by mouth every 6 (six) hours as needed for indigestion.    . benztropine (COGENTIN) 1 MG tablet Take 1 mg by mouth daily.    . clomiPRAMINE (ANAFRANIL) 50 MG capsule Take 100 mg  by mouth 2 (two) times daily.    . cloNIDine (CATAPRES) 0.2 MG tablet Take 0.2 mg by mouth 2 (two) times daily.   1  . guaiFENesin (ROBITUSSIN) 100 MG/5ML liquid Take 200 mg by mouth 4 (four) times daily as needed for cough.    . haloperidol (HALDOL) 5 MG tablet Take 5 mg by mouth 3 (three) times daily. 8am 12pm and 4pm    . hydrocortisone cream 1 % Apply 1 application topically 2 (two) times daily as needed for itching.    . hydrOXYzine (VISTARIL) 50 MG capsule Take 50 mg by mouth 3 (three) times daily.    Marland Kitchen ibuprofen (ADVIL) 200 MG tablet Take 400 mg by mouth every 6 (six) hours as needed for mild pain.    Marland Kitchen lamoTRIgine (LAMICTAL) 100 MG tablet Take 100 mg by mouth 2 (two) times daily.    Marland Kitchen loperamide (IMODIUM) 2 MG capsule Take 4 mg by mouth as needed for diarrhea or loose stools.    . magnesium hydroxide (MILK OF MAGNESIA) 400 MG/5ML suspension Take 30 mLs by mouth daily as needed for mild constipation.    . Multiple Vitamins-Minerals (MULTIVITAMIN ADULT PO) Take 1 tablet by mouth daily.    . QUEtiapine (SEROQUEL) 100 MG tablet Take 100 mg by mouth at bedtime.    . terbinafine (LAMISIL) 1 % cream Apply 1 application topically 2 (two) times daily as needed (itching).    Marland Kitchen  traZODone (DESYREL) 100 MG tablet Take 200 mg by mouth at bedtime.    . ziprasidone (GEODON) 60 MG capsule Take 60 mg by mouth 2 (two) times daily with a meal.      Musculoskeletal: Strength & Muscle Tone: within normal limits Gait & Station: normal Patient leans: N/A  Psychiatric Specialty Exam: Physical Exam  Nursing note and vitals reviewed. Constitutional: He appears well-developed.  HENT:  Head: Normocephalic.  Cardiovascular: Normal rate.  Respiratory: Effort normal.  Neurological: He is alert.  Psychiatric: He has a normal mood and affect.    Review of Systems  Constitutional: Negative.   HENT: Negative.   Eyes: Negative.   Respiratory: Negative.   Cardiovascular: Negative.   Gastrointestinal:  Negative.   Genitourinary: Negative.   Musculoskeletal: Negative.   Skin: Negative.   Neurological: Negative.   Psychiatric/Behavioral: Positive for behavioral problems.    Blood pressure 132/76, pulse (!) 126, temperature 99.4 F (37.4 C), temperature source Oral, resp. rate 16, SpO2 99 %.There is no height or weight on file to calculate BMI.  General Appearance: Casual and Fairly Groomed  Eye Contact:  Minimal  Speech:  NA  Volume:  NA  Mood:  Unable to assess  Affect:  Blunt  Thought Process:  NA  Orientation:  NA  Thought Content:  NA  Suicidal Thoughts:  Unable to assess  Homicidal Thoughts:  Unable to assess  Memory:  NA  Judgement:  NA  Insight:  NA  Psychomotor Activity:  Normal  Concentration:  Concentration: NA  Recall:  NA  Fund of Knowledge:  NA  Language:  NA  Akathisia:  No  Handed:  Right  AIMS (if indicated):     Assets:  Health and safety inspector Housing Physical Health Social Support  ADL's:  Intact  Cognition:  Impaired,  Mild  Sleep:        Treatment Plan Summary: Daily contact with patient to assess and evaluate symptoms and progress in treatment  Disposition: No evidence of imminent risk to self or others at present.   Patient does not meet criteria for psychiatric inpatient admission. Supportive therapy provided about ongoing stressors.  This service was provided via telemedicine using a 2-way, interactive audio and video technology.  Names of all persons participating in this telemedicine service and their role in this encounter. Name: Nicholas Shepherd Role: Patient  Name: [name] Role: FNP  Name: Dr. Lucianne Muss Role: Psychiatrist    Patrcia Dolly, FNP 02/03/2019 1:12 PM

## 2019-02-03 NOTE — ED Notes (Addendum)
Pt. Woke up and Running out of room closing locker doors and running around in the unit and getting into lab drawers and not following directions. Pt. Redirected back to his room and sitting on his bed at this time. Security at bedside.
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
## 2019-02-03 NOTE — ED Notes (Signed)
Pt.s breakfast has arrived. 

## 2019-02-03 NOTE — ED Notes (Addendum)
Spoke to patients group home nurse (Sarah, RN) and the nurse updated this RN that the rest of the group home tested positive for covid and will not be able to come to do the assessment on patient today.   Nurse will need a copy of patients negative test and lab results.   Nurse also wants to know the new medication changes for haldol and Ambien.   Nurse will also need a skin integrity assessment if there were any changes.   Nurse is on call  Maralyn Sago, RN [phone]

## 2019-02-03 NOTE — ED Notes (Signed)
Pt. Currently eating their lunch.

## 2019-02-04 DIAGNOSIS — F840 Autistic disorder: Secondary | ICD-10-CM | POA: Diagnosis not present

## 2019-02-04 MED ORDER — HALOPERIDOL 5 MG PO TABS: 5.0000 mg | ORAL_TABLET | Freq: Every day | ORAL | 0 refills | Status: DC

## 2019-02-04 MED ORDER — OXCARBAZEPINE 150 MG PO TABS: 150.0000 mg | ORAL_TABLET | Freq: Two times a day (BID) | ORAL | 1 refills | Status: DC

## 2019-02-04 MED ORDER — HALOPERIDOL 10 MG PO TABS: 10.0000 mg | ORAL_TABLET | Freq: Every day | ORAL | 0 refills | Status: DC

## 2019-02-04 NOTE — ED Notes (Signed)
Pt standing at door attempting to shut it multiple times. Pt told to leave the door alone. Pt acknowledged and complied.

## 2019-02-04 NOTE — ED Notes (Signed)
Nicholas Shepherd [phone] is in charge of group home.

## 2019-02-04 NOTE — Progress Notes (Addendum)
CSW called pt's group home back a fourth time and the phone number was busy intially and then  Routed to a fax line.  CSW notes's pt's listed address is:  [address]  [place] [address]  2:30 PM CSW called non-emergency dispatch and requested [employer] arrive to the group home for a "welfare check" so [employer] can make them aware pt needs to be picked up.  CSW will continue to follow for D/C needs.  Dorothe Pea. Janautica Netzley, LCSW, LCAS, CSI Transitions of Care Clinical Social Worker Care Coordination Department Ph: [phone]

## 2019-02-04 NOTE — ED Notes (Signed)
Pt dinner tray delivered. This NT will heat tray and give to pt when he wakes up.

## 2019-02-04 NOTE — ED Notes (Signed)
Pt resting in bed comfortably.

## 2019-02-04 NOTE — ED Notes (Signed)
Pt sleeping at this time.

## 2019-02-04 NOTE — ED Notes (Signed)
Pt lying in bed watching tv at this time.

## 2019-02-04 NOTE — ED Notes (Signed)
This NT attempted to take pt vitals twice but pt would not keep still in order to do that. RN notified.

## 2019-02-04 NOTE — Consult Note (Addendum)
[facility] Psych ED Discharge  02/04/2019 10:39 AM Nicholas Shepherd  MRN:  [id number] Principal Problem: Aggressive behavior Discharge Diagnoses: Principal Problem:   Aggressive behavior Active Problems:   DMDD (disruptive mood dysregulation disorder) (HCC)   Autism spectrum disorder   Intellectual disability   Subjective: Patient assessed by nurse practitioner. Patient observed lying in bed. Patient alert at this time, patient non-verbal. Per RN patient behaviors appropriate this shift, patient accepts medication by mouth without difficulty.  Patient seen along with Dr Jannifer Franklin.  Total Time spent with patient: 30 minutes  Past Psychiatric History: DMDD, Autism spectrum disorder, intellectual disability  Past Medical History:  Past Medical History:  Diagnosis Date  . Anxiety   . Autism disorder    severe per legal gaurdian  . Depression   . Nonverbal   . Otitis media   . Seizures (HCC) 11/09/2017, 03/2018   most recent seizure activity on 04/28/18 x 4 minutes   No past surgical history on file. Family History:  Family History  Problem Relation Age of Onset  . Seizures Mother        epilepsy as a child   Family Psychiatric  History: Unknown Social History:  Social History   Substance and Sexual Activity  Alcohol Use No     Social History   Substance and Sexual Activity  Drug Use No    Social History   Socioeconomic History  . Marital status: Single    Spouse name: Not on file  . Number of children: 0  . Years of education: 23  . Highest education level: Not on file  Occupational History    Comment: NA  Tobacco Use  . Smoking status: Never Smoker  . Smokeless tobacco: Never Used  Substance and Sexual Activity  . Alcohol use: No  . Drug use: No  . Sexual activity: Never  Other Topics Concern  . Not on file  Social History Narrative   Lives with mother, legal guardian   08/02/2018 living at [facility] group home   No caffeine   Social Determinants of Health    Financial Resource Strain:   . Difficulty of Paying Living Expenses: Not on file  Food Insecurity:   . Worried About Programme researcher, broadcasting/film/video in the Last Year: Not on file  . Ran Out of Food in the Last Year: Not on file  Transportation Needs:   . Lack of Transportation (Medical): Not on file  . Lack of Transportation (Non-Medical): Not on file  Physical Activity:   . Days of Exercise per Week: Not on file  . Minutes of Exercise per Session: Not on file  Stress:   . Feeling of Stress : Not on file  Social Connections:   . Frequency of Communication with Friends and Family: Not on file  . Frequency of Social Gatherings with Friends and Family: Not on file  . Attends Religious Services: Not on file  . Active Member of Clubs or Organizations: Not on file  . Attends Banker Meetings: Not on file  . Marital Status: Not on file    Has this patient used any form of tobacco in the last 30 days? (Cigarettes, Smokeless Tobacco, Cigars, and/or Pipes) A prescription for an FDA-approved tobacco cessation medication was offered at discharge and the patient refused  Current Medications: Current Facility-Administered Medications  Medication Dose Route Frequency Provider Last Rate Last Admin  . acetaminophen (TYLENOL) tablet 650 mg  650 mg Oral Q4H PRN Fayrene Helper, PA-C      .  alum & mag hydroxide-simeth (MAALOX/MYLANTA) 200-200-20 MG/5ML suspension 30 mL  30 mL Oral Q6H PRN Fayrene Helper, PA-C      . benztropine (COGENTIN) tablet 1 mg  1 mg Oral Daily Fayrene Helper, PA-C   1 mg at 02/04/19 0910  . clomiPRAMINE (ANAFRANIL) capsule 100 mg  100 mg Oral BID Fayrene Helper, PA-C   100 mg at 02/04/19 3329  . cloNIDine (CATAPRES) tablet 0.2 mg  0.2 mg Oral BID Fayrene Helper, PA-C   0.2 mg at 02/04/19 5188  . haloperidol (HALDOL) tablet 10 mg  10 mg Oral QHS Chales Abrahams, NP   Stopped at 02/04/19 0101  . haloperidol (HALDOL) tablet 5 mg  5 mg Oral Daily Ophelia Shoulder E, NP   5 mg at 02/04/19 0910  .  haloperidol lactate (HALDOL) injection 5 mg  5 mg Intramuscular Once Donnetta Hutching, MD   Stopped at 02/04/19 [phone]  . lamoTRIgine (LAMICTAL) tablet 100 mg  100 mg Oral BID Arby Barrette, MD   100 mg at 02/04/19 0910  . nicotine (NICODERM CQ - dosed in mg/24 hours) patch 21 mg  21 mg Transdermal Daily Fayrene Helper, PA-C      . OLANZapine zydis (ZYPREXA) disintegrating tablet 5 mg  5 mg Oral Q8H PRN Fayrene Helper, PA-C   5 mg at 02/03/19 0406  . ondansetron (ZOFRAN) tablet 4 mg  4 mg Oral Q8H PRN Fayrene Helper, PA-C      . OXcarbazepine (TRILEPTAL) tablet 150 mg  150 mg Oral BID Ophelia Shoulder E, NP   150 mg at 02/04/19 0910  . ziprasidone (GEODON) capsule 60 mg  60 mg Oral BID WC Fayrene Helper, PA-C   60 mg at 02/04/19 0810  . zolpidem (AMBIEN) tablet 5 mg  5 mg Oral QHS PRN Fayrene Helper, PA-C   5 mg at 02/02/19 2108   Current Outpatient Medications  Medication Sig Dispense Refill  . alum & mag hydroxide-simeth (MAALOX PLUS) 400-400-40 MG/5ML suspension Take by mouth every 6 (six) hours as needed for indigestion.    . benztropine (COGENTIN) 1 MG tablet Take 1 mg by mouth daily.    . clomiPRAMINE (ANAFRANIL) 50 MG capsule Take 100 mg by mouth 2 (two) times daily.    . cloNIDine (CATAPRES) 0.2 MG tablet Take 0.2 mg by mouth 2 (two) times daily.   1  . guaiFENesin (ROBITUSSIN) 100 MG/5ML liquid Take 200 mg by mouth 4 (four) times daily as needed for cough.    . hydrocortisone cream 1 % Apply 1 application topically 2 (two) times daily as needed for itching.    . hydrOXYzine (VISTARIL) 50 MG capsule Take 50 mg by mouth 3 (three) times daily.    Marland Kitchen ibuprofen (ADVIL) 200 MG tablet Take 400 mg by mouth every 6 (six) hours as needed for mild pain.    Marland Kitchen lamoTRIgine (LAMICTAL) 100 MG tablet Take 100 mg by mouth 2 (two) times daily.    Marland Kitchen loperamide (IMODIUM) 2 MG capsule Take 4 mg by mouth as needed for diarrhea or loose stools.    . magnesium hydroxide (MILK OF MAGNESIA) 400 MG/5ML suspension Take 30 mLs by mouth  daily as needed for mild constipation.    . Multiple Vitamins-Minerals (MULTIVITAMIN ADULT PO) Take 1 tablet by mouth daily.    . QUEtiapine (SEROQUEL) 100 MG tablet Take 100 mg by mouth at bedtime.    . terbinafine (LAMISIL) 1 % cream Apply 1 application topically 2 (two) times daily as needed (itching).    Marland Kitchen  traZODone (DESYREL) 100 MG tablet Take 200 mg by mouth at bedtime.    . ziprasidone (GEODON) 60 MG capsule Take 60 mg by mouth 2 (two) times daily with a meal.    . haloperidol (HALDOL) 10 MG tablet Take 1 tablet (10 mg total) by mouth at bedtime. 30 tablet 0  . [START ON 02/05/2019] haloperidol (HALDOL) 5 MG tablet Take 1 tablet (5 mg total) by mouth daily. 30 tablet 0  . OXcarbazepine (TRILEPTAL) 150 MG tablet Take 1 tablet (150 mg total) by mouth 2 (two) times daily. 60 tablet 1   PTA Medications: (Not in a hospital admission)   Musculoskeletal: Strength & Muscle Tone: within normal limits Gait & Station: normal Patient leans: N/A  Psychiatric Specialty Exam: Physical Exam Vitals and nursing note reviewed.  Constitutional:      Appearance: He is well-developed.  HENT:     Head: Normocephalic.  Cardiovascular:     Rate and Rhythm: Normal rate.  Pulmonary:     Effort: Pulmonary effort is normal.  Musculoskeletal:        General: Normal range of motion.     Cervical back: Normal range of motion.  Neurological:     Mental Status: He is alert. Mental status is at baseline.  Psychiatric:        Behavior: Behavior normal.     Review of Systems  Constitutional: Negative.   HENT: Negative.   Eyes: Negative.   Respiratory: Negative.   Cardiovascular: Negative.   Gastrointestinal: Negative.   Genitourinary: Negative.   Musculoskeletal: Negative.   Skin: Negative.   Neurological: Negative.     Blood pressure (!) 145/96, pulse (!) 127, temperature 99.4 F (37.4 C), temperature source Oral, resp. rate 18, SpO2 95 %.There is no height or weight on file to calculate BMI.   General Appearance: Casual and Fairly Groomed  Eye Contact:  Minimal  Speech:  NA  Volume:  NA  Mood:  NA  Affect:  Congruent  Thought Process:  NA  Orientation:  NA  Thought Content:  NA  Suicidal Thoughts:  NA  Homicidal Thoughts:  NA  Memory:  NA  Judgement:  NA  Insight:  NA  Psychomotor Activity:  Normal  Concentration:  Concentration: NA  Recall:  NA  Fund of Knowledge:  NA  Language:  NA  Akathisia:  No  Handed:  Right  AIMS (if indicated):     Assets:  Catering manager Housing Leisure Time Physical Health Resilience Social Support  ADL's:  Intact  Cognition:  Impaired,  Mild  Sleep:        Demographic Factors:  Male  Loss Factors: NA  Historical Factors: NA  Risk Reduction Factors:   Living with another person, especially a relative, Positive social support, Positive therapeutic relationship and Positive coping skills or problem solving skills  Continued Clinical Symptoms:  Medical Diagnoses and Treatments/Surgeries  Cognitive Features That Contribute To Risk:  None    Suicide Risk:  Minimal: No identifiable suicidal ideation.  Patients presenting with no risk factors but with morbid ruminations; may be classified as minimal risk based on the severity of the depressive symptoms    Plan Of Care/Follow-up recommendations:  Other:  Follow up with outpatient psychiatry  Disposition: Discharge back to group home Emmaline Kluver, [place] 02/04/2019, 10:39 AM  Patient seen face-to-face for psychiatric evaluation, chart reviewed and case discussed with the physician extender and developed treatment plan. Reviewed the information documented and agree with the treatment plan. Ginnette Gates,  MD

## 2019-02-04 NOTE — Progress Notes (Signed)
Consult request has been received. CSW attempting to follow up at present time.  CSW called pt's [facility] RN Maralyn Sago at ph: [phone] and left a HIPPA-compliant VM stating an immediate return call is necessary for the group home to assist in pt's disposition.  CSW will continue to follow for D/C needs.  Dorothe Pea. Marcina Kinnison, LCSW, LCAS, CSI Transitions of Care Clinical Social Worker Care Coordination Department Ph: [phone]

## 2019-02-04 NOTE — ED Notes (Signed)
Pt given ginger ale.

## 2019-02-04 NOTE — Progress Notes (Signed)
CSW received a call from [facility] stating they were at the Group Home and that the staff is now saying that they were told by mgt, "that mgt would not be back into the office until Tuesday and that the facility would not be picking the pt up this weekend".  CSW stated to [employer] that CSW had spoken to them earlier and that they had not updated the CSW of this earlier so the staff HAD spoken to [facility] since.  CSW asked [employer] that [employer] relayed the message that the CSW is stating that if the facility does not communicate by phone then the facility would be reported for, "neglect" in that they are not actively involving themselves in the pt's [place] and care.  CSW also asked that it be relayed that if the pt is not picked up immediately then the [facility] ED will call DSS APS and file a report asking that APD investigate the facility for abandonment as the facility has documented that staff there (RN Sarah at [facility]) were made aware yesterday that pt would l be discharged today and ready to go back to the facility this morning.  Staff at [facility] immediately called back and stated they were only working there and have not spoken to [employer].  CSW challenged this stating that they were told by [facility], per [employer] state would not pick up pt until Tuesday but had not told the CSW this earlier so staff obviously HAD spoken with mgt.  CSW stated that Aliene Beams the [facility] Owner is to be informed that DSS/APS is going to immediately be informed that the group home owner/mg is refusing to communicate with the [facility] ED and this will be reported, that the facikity will be reported to the county and state for abandonment and that Miss Nedra Hai had two hours to pick the pt up and that Miss Nedra Hai needed to call immediately to communicate with the hospital.  CSW will continue to follow for D/C needs.  Dorothe Pea. Jailani Hogans, LCSW, LCAS, CSI Transitions of Care Clinical Social Worker Care Coordination Department Ph: [phone]

## 2019-02-04 NOTE — ED Notes (Signed)
Pt ran into hallway and sat in a chair. Pt redirected back into room.

## 2019-02-04 NOTE — ED Notes (Signed)
Pt given dinner tray and vitals taken.

## 2019-02-04 NOTE — ED Provider Notes (Signed)
Emergency Medicine Observation Re-evaluation Note  Nicholas Shepherd is a 19 y.o. male, seen on rounds today.  Pt initially presented to the ED for complaints of Medical Clearance (IVC from group home for aggression.) Currently, the patient is up and awake.  Eating breakfast.  Physical Exam  BP (!) 147/89 (BP Location: Left Arm)   Pulse 100   Temp 99.4 F (37.4 C) (Oral)   Resp 18   SpO2 99%  Physical Exam HENT:     Nose: No rhinorrhea.  Pulmonary:     Effort: No respiratory distress.  Neurological:     Mental Status: He is alert.     ED Course / MDM  EKG:EKG Interpretation  Date/Time:  Thursday February 02 2019 17:15:49 EST Ventricular Rate:  93 PR Interval:  148 QRS Duration: 86 QT Interval:  370 QTC Calculation: 460 R Axis:   72 Text Interpretation: Normal sinus rhythm Nonspecific ST abnormality Abnormal ECG No significant change since last tracing Confirmed by Frederick Peers [phone]) on 02/03/2019 10:14:24 PM  Clinical Course as of Feb 04 855  Thu Feb 02, 2019  2314 Patient is now fully awake.  He is standing up in the room and trying to begin moving about.  He has taken off his shirt and does not want to sit back or lie back in the bed.  He is not violent at this time but is not redirecting.  He is perpetually trying to walk out of the room into the hallway.  Will administer Haldol 10 mg IM.   [MP]    Clinical Course User Index [MP] Arby Barrette, MD   I have reviewed the labs performed to date as well as medications administered while in observation.  Recent changes in the last 24 hours include the need for constant redirection. Plan  Current plan is patient is not a candidate for inpatient treatment.  Group home staff needs to reassess patient before they will take him back. Patient is not under full IVC at this time.   Linwood Dibbles, MD 02/04/19 0900

## 2019-02-04 NOTE — Progress Notes (Addendum)
CSW called DSS/APS and spoke to [employer] with DSS who is aware of the pt and stated she spoke to pt's mother who refused to consider picking up and Miss Polito took the rest of the CSW's report and stated it would be screened by a supervisor.  Miss Gerald Leitz also stated that to her knowledge pt had not been given an immediate D/C and that a 60 day notice had been given per pt's mother but that if this is true the 60 days had not been utilized yet.  CSW received a call from [employer] who stated a report ws filed with report # [id number] and that the [facility] parent company per [facility] website is: [employer] of [employer] with stakeholders being:  1., Colen Darling 2. Junius [employer]s 3. Benedetto Coons  CSW will continue to follow for D/C needs.  Dorothe Pea. Gwin Eagon, LCSW, LCAS, CSI Transitions of Care Clinical Social Worker Care Coordination Department Ph: [phone]

## 2019-02-04 NOTE — Progress Notes (Addendum)
CSW did not receive a return call from the [facility], "[facility] on-call RN from 1/5 and called the phone number from the pt's previous visit to the ED: "Aliene Beams, group home owner" at ph: [phone].  A staff member answered, stated this was the number for the group home and was updated the pt was ready to be picked up.  CSW was told that group home leadership would be called and that the CSW would be given a call right back at ph: [phone].  1:19 PM CSW did not receive a return call and called back to at ph: [phone] and was told Miss Nedra Hai was being called right now.  Of note, per chart review:  CSW sees on 02/03/19 that per the 1st shift ED CSW on 1/15 the group home's, "RN Maralyn Sago has already spoken with Inetta Fermo, NP with [facility] and know that patient will be evaluated overnight and will likely be discharged in the morning. Maralyn Sago reports she will be at the hospital between 5 and 6 to do the evaluation".  On 1/15 the RN had called and stated there were COVID cases in the group home and as such the RN could not arrive to perform the assessment as planned on 1/15, but as noted above, it is evident the group home was given 24 hours notice that the pt would be kept overnight and likely discharged on 1/16.  CSW will continue to follow for D/C needs.  Dorothe Pea. Roxene Alviar, LCSW, LCAS, CSI Transitions of Care Clinical Social Worker Care Coordination Department Ph: [phone]

## 2019-02-05 DIAGNOSIS — F840 Autistic disorder: Secondary | ICD-10-CM | POA: Diagnosis not present

## 2019-02-05 NOTE — Progress Notes (Addendum)
CSW called [employer] via non-emergency dispatch at ph: [phone] option 1 option 3 to arrange transport .  Pt's mother/LG at ph:  [phone] or [phone] stated she would p/u the pt if [employer] cannot.  RN updated.  CSW will continue to follow for D/C needs.  Dorothe Pea. Alyn Jurney, LCSW, LCAS, CSI Transitions of Care Clinical Social Worker Care Coordination Department Ph: [phone]      ]

## 2019-02-05 NOTE — ED Notes (Signed)
Pt. Given ginger ale and 2 cheese sticks.

## 2019-02-05 NOTE — ED Notes (Signed)
Pt off unit to home per provider. Pt calm, cooperative, no s/s of distress. Report given to [place] at [facility]. Pt ambulatory off, escorted and transported by [employer].

## 2019-02-05 NOTE — Progress Notes (Signed)
CSW called Nicholas Shepherd in [place] at [place] at ph: [phone] and the mailbox is full.  CSW left a HIPPA-compliant text which was delivered and received stating the [facility] ED needs a call back immediately regarding pt's disposition.    In calls to the pt's [facility] staff and to pt's mother/LG the CSW learned that both had been informed by mgt Nicholas Shepherd and Nicholas Shepherd with [facility] that Nicholas Shepherd has said to them the [facility] is picking pt up on Tuesday.  CSW will continue to follow for D/C needs.  Dorothe Pea. Lexia Vandevender, LCSW, LCAS, CSI Transitions of Care Clinical Social Worker Care Coordination Department Ph: [phone]

## 2019-02-05 NOTE — Progress Notes (Signed)
CSW received a call from pt's mother and legal guardian who voiced frustration at the group home for the following reasons:  1.  Per pt's mother/LG the [facility] RN Maralyn Sago has gone into pt's "Mychart" and changed pt's mother's ability to be the "proxy" on pt's "mychart" and changed pt's mother/LG from being the pt's LG.  2.  Pt's GH is receiving Medicaid/Medicare funding but is not caring for the pt.  3. Pt is remaining calm at the ED but is acting out at the [facility].   Pt's Mother L/G has provided the following numbers:  A. [facility] Emergency After-Hours # [phone] (that is accessed from the Main Line at ph: [phone])  B. State Surveyer Della Woollen at ph: [phone] or [phone] has cited the pt's [facility] numerous times for violations  C. [facility] Coordinator at [place] at ph: [phone] and her supervisor is Morrie Sheldon at ph: [phone]  D. [name] in [place] at [place] at ph: [phone]  CSW will continue to follow for D/C needs.  Dorothe Pea. Kaelyn Nauta, LCSW, LCAS, CSI Transitions of Care Clinical Social Worker Care Coordination Department Ph: [phone]

## 2019-02-05 NOTE — ED Notes (Signed)
Pt.s breakfast has arrived. 

## 2019-02-05 NOTE — Progress Notes (Signed)
CSW received a call from Eldridge Abrahams with the pt's group home who now claims she told staff at the group home to relay to the [facility] ED CSW that pt can arrive back to the group home via [facility] Dept as the pt did on a previous occasion.  CSW called ED TCU and updated them pt can, per the pt's GH, arrive back if transported by [facility], who originally brought the pt to the ED under IVC.  RN agreeable to contacting [employer] via non-emergency dispatch at ph: [phone] option 1 option 3 to arrange transport and to call report to Eldridge Abrahams of [facility] at ph: [phone].  CSW will continue to follow for D/C needs.  Dorothe Pea. Maddox Bratcher, LCSW, LCAS, CSI Transitions of Care Clinical Social Worker Care Coordination Department Ph: [phone]     \

## 2019-02-05 NOTE — Progress Notes (Signed)
CSW called pt's Group Home and spoke to [facility] QP at the group home who confirmed she was aware pt was coming back and that pt should be arriving soon.  CSW will continue to follow for D/C needs.  Dorothe Pea. Jashua Knaak, LCSW, LCAS, CSI Transitions of Care Clinical Social Worker Care Coordination Department Ph: [phone]

## 2019-02-05 NOTE — Progress Notes (Signed)
[employer] called and stated they were en route to the ED to pick up the pt now.  Eldridge Abrahams in [place] at [place] and the [facility] Group [facility]'s # was provided.  RN updated that [employer] is en route.  CSW will continue to follow for D/C needs.  Dorothe Pea. Damareon Lanni, LCSW, LCAS, CSI Transitions of Care Clinical Social Worker Care Coordination Department Ph: [phone]

## 2019-02-05 NOTE — Progress Notes (Signed)
Per notes, pt has a Care Coordinator [facility] with [place] [facility] [phone]).   CSW received a call from pt's QP's at the group home [facility] who called amnd stated they had called upper mgt many times and had forwarded the [facility] ED's message( multiple times) that the pt was cleared and needed to be picked up.  Yvonne Kendall stated that she had received a call back from Aliene Beams stating Miss Nedra Hai would call the [facility] ED CSW back and Reita Cliche called to see on multiple occasions if the CSW had received a call back from [employer].  Per Salley Hews was told that the pt would be picked up on Monday once and on another occasion that the pt was tpo be picked up on Tuesday, as Miss Nedra Hai would not be working again until Tuesday.  On 1/16 CSW received a call from [employer] stating they had filed a report (#[id number]) detailing GH's refusal to p/u the pt.  CSW will continue to follow for D/C needs.  Dorothe Pea. Frederica Chrestman, LCSW, LCAS, CSI Transitions of Care Clinical Social Worker Care Coordination Department Ph: [phone]

## 2019-02-13 ENCOUNTER — Emergency Department (HOSPITAL_COMMUNITY)
Admission: EM | Admit: 2019-02-13 | Discharge: 2019-02-13 | Disposition: A | Discharge status: Home / Self Care | Payer: Medicare Other | Attending: Emergency Medicine | Admitting: Emergency Medicine

## 2019-02-13 ENCOUNTER — Encounter (HOSPITAL_COMMUNITY): Payer: Self-pay | Admitting: Emergency Medicine

## 2019-02-13 ENCOUNTER — Other Ambulatory Visit: Payer: Self-pay

## 2019-02-13 DIAGNOSIS — Z79899 Other long term (current) drug therapy: Secondary | ICD-10-CM | POA: Insufficient documentation

## 2019-02-13 DIAGNOSIS — R569 Unspecified convulsions: Secondary | ICD-10-CM | POA: Diagnosis not present

## 2019-02-13 DIAGNOSIS — F840 Autistic disorder: Secondary | ICD-10-CM | POA: Insufficient documentation

## 2019-02-13 LAB — COMPREHENSIVE METABOLIC PANEL
ALT: 34 U/L (ref 0–44)
AST: 25 U/L (ref 15–41)
Albumin: 4.6 g/dL (ref 3.5–5.0)
Alkaline Phosphatase: 100 U/L (ref 38–126)
Anion gap: 9 (ref 5–15)
BUN: 12 mg/dL (ref 6–20)
CO2: 27 mmol/L (ref 22–32)
Calcium: 9.4 mg/dL (ref 8.9–10.3)
Chloride: 100 mmol/L (ref 98–111)
Creatinine, Ser: 0.95 mg/dL (ref 0.61–1.24)
GFR calc Af Amer: >60 mL/min (ref >60)
GFR calc non Af Amer: >60 mL/min (ref >60)
Glucose, Bld: 104 mg/dL — ABNORMAL HIGH (ref 70–99)
Potassium: 4 mmol/L (ref 3.5–5.1)
Sodium: 136 mmol/L (ref 135–145)
Total Bilirubin: 0.6 mg/dL (ref 0.3–1.2)
Total Protein: 7.6 g/dL (ref 6.5–8.1)

## 2019-02-13 LAB — CBC WITH DIFFERENTIAL/PLATELET
Abs Immature Granulocytes: 0.13 10*3/uL — ABNORMAL HIGH (ref 0.00–0.07)
Basophils Absolute: 0 10*3/uL (ref 0.0–0.1)
Basophils Relative: 0 %
Eosinophils Absolute: 0 10*3/uL (ref 0.0–0.5)
Eosinophils Relative: 0 %
HCT: 44.5 % (ref 39.0–52.0)
Hemoglobin: 14.7 g/dL (ref 13.0–17.0)
Immature Granulocytes: 1 %
Lymphocytes Relative: 11 %
Lymphs Abs: 1.3 10*3/uL (ref 0.7–4.0)
MCH: 27.6 pg (ref 26.0–34.0)
MCHC: 33 g/dL (ref 30.0–36.0)
MCV: 83.5 fL (ref 80.0–100.0)
Monocytes Absolute: 0.7 10*3/uL (ref 0.1–1.0)
Monocytes Relative: 6 %
Neutro Abs: 9.4 10*3/uL — ABNORMAL HIGH (ref 1.7–7.7)
Neutrophils Relative %: 82 %
Platelets: 283 10*3/uL (ref 150–400)
RBC: 5.33 MIL/uL (ref 4.22–5.81)
RDW: 13.9 % (ref 11.5–15.5)
WBC: 11.5 10*3/uL — ABNORMAL HIGH (ref 4.0–10.5)
nRBC: 0 % (ref 0.0–0.2)

## 2019-02-13 MED ORDER — HALOPERIDOL LACTATE 5 MG/ML IJ SOLN
10.0000 mg | Freq: Once | INTRAMUSCULAR | Status: AC
  Administered 2019-02-13: 10 mg via INTRAMUSCULAR
  Filled 2019-02-13: qty 2

## 2019-02-13 MED ORDER — LAMOTRIGINE 100 MG PO TABS
100.0000 mg | ORAL_TABLET | Freq: Once | ORAL | Status: AC
  Administered 2019-02-13: 16:00:00 100 mg via ORAL
  Filled 2019-02-13: qty 1

## 2019-02-13 NOTE — ED Provider Notes (Signed)
[facility] DEPT Provider Note   CSN: [id number] Arrival date & time: 02/13/19  1446  History Chief Complaint  Patient presents with  . Seizures    Nicholas Shepherd is a 19 y.o. male who presents with history of autism, seizure d/o, aggressive behavior who presents with a seizure at his group home. Pt is non-verbal. This is his 3rd visit ED visit this month for behavioral issues or seizure. Apparently he had seizure like activity while seated in a chair today. It lasted <5 min and spontaneously stopped. There was no reported trauma. EMS responded to the scene and transported the patient without incident or any additional interventions. He takes Lamictal.  I spoke with his mother who states that as far she knows he has not missed any doses of his medicine and has been overall well.  Level 5 caveat due to patient being nonverbal.  HPI     Past Medical History:  Diagnosis Date  . Anxiety   . Autism disorder    severe per legal gaurdian  . Depression   . Nonverbal   . Otitis media   . Seizures ([place]) 11/09/2017, 03/2018   most recent seizure activity on 04/28/18 x 4 minutes    Patient Active Problem List   Diagnosis Date Noted  . Seizure disorder ([place]) 02/03/2019  . DMDD (disruptive mood dysregulation disorder) ([place]) 11/06/2017  . Autism spectrum disorder 11/06/2017  . Intellectual disability 11/06/2017  . Aggressive behavior 06/09/2017    History reviewed. No pertinent surgical history.     Family History  Problem Relation Age of Onset  . Seizures Mother        epilepsy as a child    Social History   Tobacco Use  . Smoking status: Never Smoker  . Smokeless tobacco: Never Used  Substance Use Topics  . Alcohol use: No  . Drug use: No    Home Medications Prior to Admission medications   Medication Sig Start Date End Date Taking? Authorizing Provider  alum & mag hydroxide-simeth (MAALOX PLUS) 400-400-40 MG/5ML suspension Take by mouth  every 6 (six) hours as needed for indigestion.    [provider]  benztropine (COGENTIN) 1 MG tablet Take 1 mg by mouth daily.    [provider]  clomiPRAMINE (ANAFRANIL) 50 MG capsule Take 100 mg by mouth 2 (two) times daily.    [provider]  cloNIDine (CATAPRES) 0.2 MG tablet Take 0.2 mg by mouth 2 (two) times daily.  05/20/17   [provider]  guaiFENesin (ROBITUSSIN) 100 MG/5ML liquid Take 200 mg by mouth 4 (four) times daily as needed for cough.    [provider]  haloperidol (HALDOL) 10 MG tablet Take 1 tablet (10 mg total) by mouth at bedtime. 02/04/19   Emmaline Kluver, FNP  haloperidol (HALDOL) 5 MG tablet Take 1 tablet (5 mg total) by mouth daily. 02/05/19   Emmaline Kluver, FNP  hydrocortisone cream 1 % Apply 1 application topically 2 (two) times daily as needed for itching.    [provider]  hydrOXYzine (VISTARIL) 50 MG capsule Take 50 mg by mouth 3 (three) times daily. 01/13/19   [provider]  ibuprofen (ADVIL) 200 MG tablet Take 400 mg by mouth every 6 (six) hours as needed for mild pain.    [provider]  lamoTRIgine (LAMICTAL) 100 MG tablet Take 100 mg by mouth 2 (two) times daily.    [provider]  loperamide (IMODIUM) 2 MG  capsule Take 4 mg by mouth as needed for diarrhea or loose stools.    [provider]  magnesium hydroxide (MILK OF MAGNESIA) 400 MG/5ML suspension Take 30 mLs by mouth daily as needed for mild constipation.    [provider]  Multiple Vitamins-Minerals (MULTIVITAMIN ADULT PO) Take 1 tablet by mouth daily.    [provider]  OXcarbazepine (TRILEPTAL) 150 MG tablet Take 1 tablet (150 mg total) by mouth 2 (two) times daily. 02/04/19   Patrcia Dolly, FNP  QUEtiapine (SEROQUEL) 100 MG tablet Take 100 mg by mouth at bedtime.    [provider]  terbinafine (LAMISIL) 1 % cream Apply 1 application topically 2 (two) times daily as needed (itching).     [provider]  traZODone (DESYREL) 100 MG tablet Take 200 mg by mouth at bedtime.    [provider]  ziprasidone (GEODON) 60 MG capsule Take 60 mg by mouth 2 (two) times daily with a meal.    [provider]    Allergies    Ativan [lorazepam], Depakote [divalproex sodium], and Vyvanse [lisdexamfetamine]  Review of Systems   Review of Systems  Unable to perform ROS: Patient nonverbal    Physical Exam Updated Vital Signs BP 123/68   Pulse (!) 104   Temp 98 F (36.7 C) (Oral)   Resp 18   SpO2 98%   Physical Exam Vitals and nursing note reviewed.  Constitutional:      General: He is not in acute distress.    Appearance: Normal appearance. He is well-developed. He is not ill-appearing.     Comments: Alert. Non-verbal. Makes some hand gestures towards himself  HENT:     Head: Normocephalic and atraumatic.  Eyes:     General: No scleral icterus.       Right eye: No discharge.        Left eye: No discharge.     Conjunctiva/sclera: Conjunctivae normal.     Pupils: Pupils are equal, round, and reactive to light.  Cardiovascular:     Rate and Rhythm: Normal rate and regular rhythm.  Pulmonary:     Effort: Pulmonary effort is normal. No respiratory distress.     Breath sounds: Normal breath sounds.  Abdominal:     General: There is no distension.  Musculoskeletal:     Cervical back: Normal range of motion.  Skin:    General: Skin is warm and dry.  Neurological:     Mental Status: He is alert and oriented to person, place, and time.  Psychiatric:        Behavior: Behavior normal.     ED Results / Procedures / Treatments   Labs (all labs ordered are listed, but only abnormal results are displayed) Labs Reviewed  COMPREHENSIVE METABOLIC PANEL - Abnormal; Notable for the following components:      Result Value   Glucose, Bld 104 (*)    All other components within normal limits  CBC WITH DIFFERENTIAL/PLATELET - Abnormal; Notable for the  following components:   WBC 11.5 (*)    Neutro Abs 9.4 (*)    Abs Immature Granulocytes 0.13 (*)    All other components within normal limits  LAMOTRIGINE LEVEL    EKG None  Radiology No results found.  Procedures Procedures (including critical care time)  Medications Ordered in ED Medications  lamoTRIgine (LAMICTAL) tablet 100 mg (100 mg Oral Given 02/13/19 1544)  haloperidol lactate (HALDOL) injection 10 mg (10 mg Intramuscular Given 02/13/19 1802)  ED Course  I have reviewed the triage vital signs and the nursing notes.  Pertinent labs & imaging results that were available during my care of the patient were reviewed by me and considered in my medical decision making (see chart for details).  19 year old male presents with reported seizure at his group home today.  Blood pressure is slightly elevated but otherwise vitals are reassuring.  Patient is alert and seems to be at his baseline per chart review.  Seizure occurred while he was in a seated position and there was no reported trauma.  Will obtain basic labs, Lamictal level.  We will give him a dose of Lamictal here.  Labs are overall reassuring.  Patient has been observed for 5 hours and there has been no further seizure-like activity.  He has been somewhat restless and did require IM Haldol.  Discussed the ED course with his mother who is his legal guardian.  She verbalized understanding.  Will arrange for transport back to his group home.  MDM Rules/Calculators/A&P                       Final Clinical Impression(s) / ED Diagnoses Final diagnoses:  Seizure [facility])    Rx / DC Orders ED Discharge Orders    None       Beryle Quant 02/13/19 2004    Lorre Nick, MD 02/14/19 [phone]

## 2019-02-13 NOTE — ED Triage Notes (Signed)
Arrives via EMS from group home, EMS reports he is non-verbal, had a witnessed seizure today, hx of aggressive behavior. CBG 96 with EMS.

## 2019-02-13 NOTE — ED Notes (Signed)
Patient was pacing around room, agitated, not cooperative with staff, refuses to wear a mask and walks the halls. No aggression noted. Haldol given IM per order.

## 2019-02-13 NOTE — ED Notes (Signed)
Pt mother called to see if pt has been picked up by group home yet and was advised that he was still here. Also no definite time was given to nurse regarding pick up time. Pt mother stated that she would be calling the group home to see how long it would be for him to be picked up.

## 2019-02-13 NOTE — ED Notes (Signed)
Pt calm and cooperative for IV start.  Pt provided warm blanket.

## 2019-02-13 NOTE — ED Notes (Signed)
[facility] contacted to pick pt up to bring him home. They said they will send someone as soon as possible to pick him up. Pt's mother informed that he is up for discharge and that the group home is picking him up.

## 2019-02-14 ENCOUNTER — Telehealth: Payer: Self-pay | Admitting: Family Medicine

## 2019-02-14 LAB — LAMOTRIGINE LEVEL: Lamotrigine Lvl: 3.5 ug/mL (ref 2.0–20.0)

## 2019-02-14 NOTE — Telephone Encounter (Signed)
Nicholas Shepherd has called asking for a virtual visit for pt due a seizure that pt had that lasted over 5 mins on yesterday.  The link was sent to [email or web address].  Nicholas Shepherd confirmed getting the link

## 2019-02-14 NOTE — Telephone Encounter (Signed)
Noted  

## 2019-02-15 ENCOUNTER — Telehealth (INDEPENDENT_AMBULATORY_CARE_PROVIDER_SITE_OTHER): Payer: Medicare Other | Admitting: Family Medicine

## 2019-02-15 ENCOUNTER — Telehealth: Payer: Self-pay | Admitting: Family Medicine

## 2019-02-15 ENCOUNTER — Other Ambulatory Visit: Payer: Self-pay

## 2019-02-15 DIAGNOSIS — F840 Autistic disorder: Secondary | ICD-10-CM

## 2019-02-15 DIAGNOSIS — G40909 Epilepsy, unspecified, not intractable, without status epilepticus: Secondary | ICD-10-CM

## 2019-02-15 MED ORDER — LAMOTRIGINE 100 MG PO TABS: 100.0000 mg | ORAL_TABLET | Freq: Two times a day (BID) | ORAL | 11 refills | Status: DC

## 2019-02-15 NOTE — Progress Notes (Signed)
   PATIENT: Nicholas Shepherd DOB: 2000-10-09  REASON FOR VISIT: follow up HISTORY FROM: patient  Virtual Visit via Telephone Note  I connected with Nicholas Shepherd on 02/15/19 at  9:30 AM EST by telephone and verified that I am speaking with the correct person using two identifiers.   I discussed the limitations, risks, security and privacy concerns of performing an evaluation and management service by telephone and the availability of in person appointments. I also discussed with the patient that there may be a patient responsible charge related to this service. The patient expressed understanding and agreed to proceed.   History of Present Illness:  02/15/19 Nicholas Shepherd is a 19 y.o. male here today for follow up for seizure. He was admitted to hospital for involuntary commitment on 02/01/2019. Sarah, group home caregiver, reports that lamotrigine, seroquel and Geodon were held for about 24 hours. He reportedly had a seizure on 1/14 while hospitalized. At discharge, oxcarbamazepine 150mg  twice daily was added. On 1/25 he had another tonic clonic seizure lasting > 5 minutes. EMS was called and patient evaluated by ER. Labs were unremarkable. Lamotrigine level on the low side at 3.5. No injuries or trauma reported. He continues to follow closely with psychiatry for autism and behavior concerns. Of note, patient has gained about 50 pounds since being in group home. Weight today is 173.   History (copied from my note on 11/30/2018)  Nicholas Shepherd is a 19 y.o. male here today with history of nonverbal autism for follow up for seizure disorder. He continues lamotrigine 100mg  twice daily. He has not had any seizure activity recently. Nurse and caregiver present today report last seizure was 04/28/2018.  He continues to reside in a group home.  He is followed closely by psychiatry.   Observations/Objective:  Not able to assess due to nature of visit   Assessment and Plan:  19 y.o. year old  male  has a past medical history of Anxiety, Autism disorder, Depression, Nonverbal, Otitis media, and Seizures ([facility]) (11/09/2017, 03/2018). here with    ICD-10-CM   1. Seizure disorder ([facility])  G40.909   2. Autism  F84.0    Harce was in IVC on 02/01/2019 with seizure on 02/02/2019. Oxcarbamazepine was added on 02/04/2019. Second seizure on 02/13/2019, generalized lasting > 5 minutes. ER evaluation revealed lamotrigine level 3.5. We will increase lamotrigine to 100mg  in the am and 150mg  at night. Continue oxcarbamazepine 150mg  BID. 02/06/2019 will monitor closely for any new or worsening symptoms. Continue close follow up with PCP and psychiatry. We will recheck lamotrigine and oxcarbamazepine levels in 3 months with virtual follow up following. 02/15/2019 will call sooner for continued seizure activity. She verbalizes understanding and agreement with this plan.    No orders of the defined types were placed in this encounter.   No orders of the defined types were placed in this encounter.    Follow Up Instructions:  I discussed the assessment and treatment plan with the patient. The patient was provided an opportunity to ask questions and all were answered. The patient agreed with the plan and demonstrated an understanding of the instructions.   The patient was advised to call back or seek an in-person evaluation if the symptoms worsen or if the condition fails to improve as anticipated.  I provided 15 minutes of non-face-to-face time during this encounter. Doxy me visit completed with , group home caregiver. Provider is int he office.    , NP

## 2019-02-15 NOTE — Progress Notes (Signed)
I reviewed note and agree with plan.   Suanne Marker, MD 02/15/2019, 10:42 AM Certified in Neurology, Neurophysiology and Neuroimaging  [facility] [address], [address] [place], [place] [address] [phone]

## 2019-02-15 NOTE — Telephone Encounter (Signed)
Nicholas Shepherd has called stating re: pt's lamoTRIgine (LAMICTAL) 100 MG tablet the correct p.m. dosage was not called into the pharmacy.  Nicholas Shepherd is asking for a call from RN

## 2019-02-15 NOTE — Patient Instructions (Signed)
Continue oxcarbamazepine 150mg  twice daily  Increase lamortigine to 100mg  in the am and 150mg  in the pm.   Monitor for any new or worsening symptoms. Monitor hydration status regularly. Repeat lamotrigine and oxcarbamazepine levels in 3 months with virtual follow up following.     Seizure, Adult A seizure is a sudden burst of abnormal electrical activity in the brain. Seizures usually last from 30 seconds to 2 minutes. They can cause many different symptoms. Usually, seizures are not harmful unless they last a long time. What are the causes? Common causes of this condition include:  Fever or infection.  Conditions that affect the brain, such as: ? A brain abnormality that you were born with. ? A brain or head injury. ? Bleeding in the brain. ? A tumor. ? Stroke. ? Brain disorders such as autism or cerebral palsy.  Low blood sugar.  Conditions that are passed from parent to child (are inherited).  Problems with substances, such as: ? Having a reaction to a drug or a medicine. ? Suddenly stopping the use of a substance (withdrawal). In some cases, the cause may not be known. A person who has repeated seizures over time without a clear cause has a condition called epilepsy. What increases the risk? You are more likely to get this condition if you have:  A family history of epilepsy.  Had a seizure in the past.  A brain disorder.  A history of head injury, lack of oxygen at birth, or strokes. What are the signs or symptoms? There are many types of seizures. The symptoms vary depending on the type of seizure you have. Examples of symptoms during a seizure include:  Shaking (convulsions).  Stiffness in the body.  Passing out (losing consciousness).  Head nodding.  Staring.  Not responding to sound or touch.  Loss of bladder control and bowel control. Some people have symptoms right before and right after a seizure happens. Symptoms before a seizure may include:   Fear.  Worry (anxiety).  Feeling like you may vomit (nauseous).  Feeling like the room is spinning (vertigo).  Feeling like you saw or heard something before (dj vu).  Odd tastes or smells.  Changes in how you see. You may see flashing lights or spots. Symptoms after a seizure happens can include:  Confusion.  Sleepiness.  Headache.  Weakness on one side of the body. How is this treated? Most seizures will stop on their own in under 5 minutes. In these cases, no treatment is needed. Seizures that last longer than 5 minutes will usually need treatment. Treatment can include:  Medicines given through an IV tube.  Avoiding things that are known to cause your seizures. These can include medicines that you take for another condition.  Medicines to treat epilepsy.  Surgery to stop the seizures. This may be needed if medicines do not help. Follow these instructions at home: Medicines  Take over-the-counter and prescription medicines only as told by your doctor.  Do not eat or drink anything that may keep your medicine from working, such as alcohol. Activity  Do not do any activities that would be dangerous if you had another seizure, like driving or swimming. Wait until your doctor says it is safe for you to do them.  If you live in the [place] ask your local DMV (department of motor vehicles) when you can drive.  Get plenty of rest. Teaching others Teach friends and family what to do when you have a seizure. They should:  Lay you on the ground.  Protect your head and body.  Loosen any tight clothing around your neck.  Turn you on your side.  Not hold you down.  Not put anything into your mouth.  Know whether or not you need emergency care.  Stay with you until you are better.  General instructions  Contact your doctor each time you have a seizure.  Avoid anything that gives you seizures.  Keep a seizure diary. Write down: ? What you think caused each  seizure. ? What you remember about each seizure.  Keep all follow-up visits as told by your doctor. This is important. Contact a doctor if:  You have another seizure.  You have seizures more often.  There is any change in what happens during your seizures.  You keep having seizures with treatment.  You have symptoms of being sick or having an infection. Get help right away if:  You have a seizure that: ? Lasts longer than 5 minutes. ? Is different than seizures you had before. ? Makes it harder to breathe. ? Happens after you hurt your head.  You have any of these symptoms after a seizure: ? Not being able to speak. ? Not being able to use a part of your body. ? Confusion. ? A bad headache.  You have two or more seizures in a row.  You do not wake up right after a seizure.  You get hurt during a seizure. These symptoms may be an emergency. Do not wait to see if the symptoms will go away. Get medical help right away. Call your local emergency services (911 in the [place] Do not drive yourself to the hospital. Summary  Seizures usually last from 30 seconds to 2 minutes. Usually, they are not harmful unless they last a long time.  Do not eat or drink anything that may keep your medicine from working, such as alcohol.  Teach friends and family what to do when you have a seizure.  Contact your doctor each time you have a seizure. This information is not intended to replace advice given to you by your health care provider. Make sure you discuss any questions you have with your health care provider. Document Revised: 03/25/2018 Document Reviewed: 03/25/2018 Elsevier Patient Education  [employer].

## 2019-02-16 ENCOUNTER — Telehealth: Payer: Self-pay

## 2019-02-16 MED ORDER — LAMOTRIGINE 100 MG PO TABS: ORAL_TABLET | ORAL | 11 refills | Status: DC

## 2019-02-16 NOTE — Telephone Encounter (Signed)
Maralyn Sago was advised

## 2019-02-16 NOTE — Addendum Note (Signed)
Addended by: Guy Begin on: 02/16/2019 11:41 AM   Modules accepted: Orders

## 2019-02-16 NOTE — Telephone Encounter (Signed)
AVS has been faxed to the patient's group home to the attention of Maralyn Sago. Confirmation fax has been received.

## 2019-02-16 NOTE — Telephone Encounter (Signed)
Order redone at the pharmacy.

## 2019-02-16 NOTE — Telephone Encounter (Signed)
I called and LMVM for Nicholas Shepherd.  I relayed the prescription was redone to [facility] lamotrigine 100mg  po am and 150mg  po pm.  She is to call back if needed.

## 2019-02-23 ENCOUNTER — Telehealth: Payer: Self-pay | Admitting: Family Medicine

## 2019-02-23 MED ORDER — OXCARBAZEPINE 150 MG PO TABS: 150.0000 mg | ORAL_TABLET | Freq: Two times a day (BID) | ORAL | 1 refills | Status: DC

## 2019-02-23 NOTE — Telephone Encounter (Signed)
Pt is requesting a refill of OXcarbazepine (TRILEPTAL) 150 MG tablet, to be sent to [facility] - [place], [place] - [address]  They would like a 65mo supply to be sent

## 2019-02-23 NOTE — Telephone Encounter (Signed)
Placed order for trileptal for 90 day supply and one refill to new pharmacy.

## 2019-02-24 ENCOUNTER — Telehealth: Payer: Self-pay | Admitting: Family Medicine

## 2019-02-24 NOTE — Telephone Encounter (Signed)
[name], RN@ Group Home is asking for a call from Amy, NP to discuss pt having another seizure yesterday evening.  Please call her at [phone]

## 2019-02-27 NOTE — Telephone Encounter (Signed)
LMVM for # provided to cal back about [place].

## 2019-02-27 NOTE — Telephone Encounter (Signed)
LMVM for Lubertha Basque to call re: [place] Seizures.

## 2019-02-27 NOTE — Telephone Encounter (Signed)
Thank you for keeping me posted. We may need to increase lamotrigine again if seizures continue. Can we check lamotrigine and oxcarbamazepine levels with PCP?

## 2019-02-28 NOTE — Telephone Encounter (Signed)
I called group home for pt.  The # left for me was incorrect > it is [phone].  Sara's cell.  He had seizure while doing puzzle, no trigger. Compliant on meds as listed in med list.  This seizure last 3 minutes. No more sz since then.  He has pcp lab visit scheduled 04-23-19.  Let me know if change in plan. I will call her back, other wise will leave plan as is, with no call needed.

## 2019-03-01 NOTE — Telephone Encounter (Signed)
Nicholas Shepherd from the group home called to discuss medications. Please advise. [phone]

## 2019-03-01 NOTE — Telephone Encounter (Signed)
I returned call and no change.  Checking on med adjustment.  I relayed I have not heard back, so no change at this time.

## 2019-03-08 ENCOUNTER — Emergency Department (HOSPITAL_COMMUNITY)
Admission: EM | Admit: 2019-03-08 | Discharge: 2019-03-08 | Disposition: A | Discharge status: Home / Self Care | Payer: Medicare Other | Attending: Emergency Medicine | Admitting: Emergency Medicine

## 2019-03-08 ENCOUNTER — Other Ambulatory Visit: Payer: Self-pay

## 2019-03-08 DIAGNOSIS — Z79899 Other long term (current) drug therapy: Secondary | ICD-10-CM | POA: Diagnosis not present

## 2019-03-08 DIAGNOSIS — F79 Unspecified intellectual disabilities: Secondary | ICD-10-CM | POA: Insufficient documentation

## 2019-03-08 DIAGNOSIS — F840 Autistic disorder: Secondary | ICD-10-CM | POA: Diagnosis not present

## 2019-03-08 DIAGNOSIS — R569 Unspecified convulsions: Secondary | ICD-10-CM | POA: Diagnosis present

## 2019-03-08 LAB — MAGNESIUM: Magnesium: 2.1 mg/dL (ref 1.7–2.4)

## 2019-03-08 LAB — COMPREHENSIVE METABOLIC PANEL
ALT: 47 U/L — ABNORMAL HIGH (ref 0–44)
AST: 31 U/L (ref 15–41)
Albumin: 4.3 g/dL (ref 3.5–5.0)
Alkaline Phosphatase: 107 U/L (ref 38–126)
Anion gap: 10 (ref 5–15)
BUN: 11 mg/dL (ref 6–20)
CO2: 25 mmol/L (ref 22–32)
Calcium: 9.2 mg/dL (ref 8.9–10.3)
Chloride: 99 mmol/L (ref 98–111)
Creatinine, Ser: 0.87 mg/dL (ref 0.61–1.24)
GFR calc Af Amer: >60 mL/min (ref >60)
GFR calc non Af Amer: >60 mL/min (ref >60)
Glucose, Bld: 90 mg/dL (ref 70–99)
Potassium: 4.4 mmol/L (ref 3.5–5.1)
Sodium: 134 mmol/L — ABNORMAL LOW (ref 135–145)
Total Bilirubin: 0.4 mg/dL (ref 0.3–1.2)
Total Protein: 7.1 g/dL (ref 6.5–8.1)

## 2019-03-08 LAB — CBC WITH DIFFERENTIAL/PLATELET
Abs Immature Granulocytes: 0.07 10*3/uL (ref 0.00–0.07)
Basophils Absolute: 0 10*3/uL (ref 0.0–0.1)
Basophils Relative: 1 %
Eosinophils Absolute: 0 10*3/uL (ref 0.0–0.5)
Eosinophils Relative: 0 %
HCT: 44.3 % (ref 39.0–52.0)
Hemoglobin: 14.6 g/dL (ref 13.0–17.0)
Immature Granulocytes: 1 %
Lymphocytes Relative: 25 %
Lymphs Abs: 1.9 10*3/uL (ref 0.7–4.0)
MCH: 27.7 pg (ref 26.0–34.0)
MCHC: 33 g/dL (ref 30.0–36.0)
MCV: 84.1 fL (ref 80.0–100.0)
Monocytes Absolute: 0.6 10*3/uL (ref 0.1–1.0)
Monocytes Relative: 8 %
Neutro Abs: 5.2 10*3/uL (ref 1.7–7.7)
Neutrophils Relative %: 65 %
Platelets: 294 10*3/uL (ref 150–400)
RBC: 5.27 MIL/uL (ref 4.22–5.81)
RDW: 13.4 % (ref 11.5–15.5)
WBC: 7.9 10*3/uL (ref 4.0–10.5)
nRBC: 0 % (ref 0.0–0.2)

## 2019-03-08 NOTE — Discharge Instructions (Addendum)
As discussed, your evaluation today has been largely reassuring.  But, it is important that you monitor your condition carefully, and do not hesitate to return to the ED if you develop new, or concerning changes in your condition.  Otherwise, please follow-up with your physician for appropriate ongoing care.  Additional labs have been sent to evaluate the level of antiseizure medication in your bloodstream.  These results should be available in the coming days.

## 2019-03-08 NOTE — ED Notes (Addendum)
Per Maralyn Sago, RN from [facility] [phone]-    Seizure for 5 minutes, group home protocol is to send out pt for seizures lasting 5 or more minutes.   Sarah requests we check lamotrogine level.   No med changes in over one month.    Sarah reports facility contacted legal guardian to inform her pt was being transported to ED.

## 2019-03-08 NOTE — ED Triage Notes (Signed)
Pt BIBA from [facility]-  Per EMS- Staff reports 5 min witnessed seizure, pt was laying on bed, no injuries reported. Pt with hx of same, staff reports pt compliant with meds. Pt arrives alert, tired. Pt nonverbal at baseline.   Staff also request that we check oxcarbazepine and lamotrigine levels.

## 2019-03-08 NOTE — ED Provider Notes (Signed)
[place] DEPT Provider Note   CSN: [id number] Arrival date & time: 03/08/19  1636     History Chief Complaint  Patient presents with  . Seizures    Nicholas Shepherd is a 19 y.o. male.  HPI    Patient presents after a witnessed seizure.  He has a known seizure disorder, as well as multiple other medical issues, lives in a group home. Reportedly he has been prescribed his medication has been taking it regularly. He is also aphasic, at baseline, incapable of providing history, level 5 caveat. Seizure was reportedly about 5 minutes in length, had no trauma, and prior to it he was in his usual state of health. History obtained via EMS report and via group home staff from nursing conversations.     Past Medical History:  Diagnosis Date  . Anxiety   . Autism disorder    severe per legal gaurdian  . Depression   . Nonverbal   . Otitis media   . Seizures ([place]) 11/09/2017, 03/2018   most recent seizure activity on 04/28/18 x 4 minutes    Patient Active Problem List   Diagnosis Date Noted  . Seizure disorder ([place]) 02/03/2019  . DMDD (disruptive mood dysregulation disorder) ([place]) 11/06/2017  . Autism spectrum disorder 11/06/2017  . Intellectual disability 11/06/2017  . Aggressive behavior 06/09/2017    No past surgical history on file.     Family History  Problem Relation Age of Onset  . Seizures Mother        epilepsy as a child    Social History   Tobacco Use  . Smoking status: Never Smoker  . Smokeless tobacco: Never Used  Substance Use Topics  . Alcohol use: No  . Drug use: No    Home Medications Prior to Admission medications   Medication Sig Start Date End Date Taking? Authorizing Provider  acetaminophen (TYLENOL) 325 MG tablet Take 650 mg by mouth every 4 (four) hours as needed for mild pain or headache.    [provider]  alum & mag hydroxide-simeth (MAALOX PLUS) 400-400-40 MG/5ML suspension Take by mouth  every 6 (six) hours as needed for indigestion.    [provider]  benztropine (COGENTIN) 1 MG tablet Take 1 mg by mouth daily.    [provider]  clomiPRAMINE (ANAFRANIL) 50 MG capsule Take 100 mg by mouth 2 (two) times daily.    [provider]  cloNIDine (CATAPRES) 0.2 MG tablet Take 0.2 mg by mouth 2 (two) times daily.  05/20/17   [provider]  guaiFENesin (ROBITUSSIN) 100 MG/5ML liquid Take 200 mg by mouth 4 (four) times daily as needed for cough.    [provider]  haloperidol (HALDOL) 10 MG tablet Take 1 tablet (10 mg total) by mouth at bedtime. Patient taking differently: Take 5 mg by mouth 2 (two) times daily.  02/04/19   Emmaline Kluver, FNP  hydrocortisone cream 1 % Apply 1 application topically 2 (two) times daily as needed for itching.    [provider]  hydrOXYzine (VISTARIL) 50 MG capsule Take 50 mg by mouth 3 (three) times daily. 01/13/19   [provider]  ibuprofen (ADVIL) 200 MG tablet Take 400 mg by mouth every 6 (six) hours as needed for mild pain.    [provider]  lamoTRIgine (LAMICTAL) 100 MG tablet Take 1 tablet in AM and 1 1/2 tablets in the PM 02/16/19   Suzzanne Cloud, NP  loperamide (IMODIUM) 2  MG capsule Take 4 mg by mouth as needed for diarrhea or loose stools.    [provider]  magnesium hydroxide (MILK OF MAGNESIA) 400 MG/5ML suspension Take 30 mLs by mouth daily as needed for mild constipation.    [provider]  Multiple Vitamins-Minerals (MULTIVITAMIN ADULT PO) Take 1 tablet by mouth daily.    [provider]  OXcarbazepine (TRILEPTAL) 150 MG tablet Take 1 tablet (150 mg total) by mouth 2 (two) times daily. 02/23/19   Lomax, Amy, NP  QUEtiapine (SEROQUEL) 100 MG tablet Take 100 mg by mouth at bedtime.    [provider]  terbinafine (LAMISIL) 1 % cream Apply 1 application topically 2 (two) times daily as needed (itching).    [provider]    traZODone (DESYREL) 100 MG tablet Take 200 mg by mouth at bedtime.    [provider]    Allergies    Ativan [lorazepam], Depakote [divalproex sodium], and Vyvanse [lisdexamfetamine]  Review of Systems   Review of Systems  Unable to perform ROS: Patient nonverbal    Physical Exam Updated Vital Signs BP (!) 132/59 (BP Location: Left Arm)   Pulse 100   Temp 98.3 F (36.8 C) (Oral)   Resp 16   SpO2 97%   Physical Exam Vitals and nursing note reviewed.  Constitutional:      General: He is not in acute distress.    Appearance: He is well-developed.  HENT:     Head: Normocephalic and atraumatic.  Eyes:     Conjunctiva/sclera: Conjunctivae normal.  Cardiovascular:     Rate and Rhythm: Normal rate and regular rhythm.  Pulmonary:     Effort: Pulmonary effort is normal. No respiratory distress.     Breath sounds: No stridor.  Abdominal:     General: There is no distension.  Skin:    General: Skin is warm and dry.  Neurological:     Mental Status: He is alert.     Comments: Patient in state consistent with postictal phase, moving extremities minimally, spontaneously, resting in the right lateral decubitus position, noninteractive.  Psychiatric:     Comments: Withdrawn, noninteractive     ED Results / Procedures / Treatments   Labs (all labs ordered are listed, but only abnormal results are displayed) Labs Reviewed  COMPREHENSIVE METABOLIC PANEL - Abnormal; Notable for the following components:      Result Value   Sodium 134 (*)    ALT 47 (*)    All other components within normal limits  CBC WITH DIFFERENTIAL/PLATELET  MAGNESIUM  LAMOTRIGINE LEVEL    Procedures Procedures (including critical care time)  ED Course  I have reviewed the triage vital signs and the nursing notes.  Pertinent labs & imaging results that were available during my care of the patient were reviewed by me and considered in my medical decision making (see chart for  details).  Update: Patient is sitting upright, eating.  7:12 PM Patient upright, eating, giving fist bumps.  Patient in no distress, vital signs remain unremarkable, labs unremarkable.  Per group home request a Lamictal level was sent, this lab is not available in the emergency department. Absent other complaints, evidence for trauma, hemodynamic stability, with a known history of seizure disorder, patient is appropriate to return to his group home.   Final Clinical Impression(s) / ED Diagnoses Final diagnoses:  Seizure [facility])     Gerhard Munch, MD 03/08/19 [phone]

## 2019-03-09 ENCOUNTER — Other Ambulatory Visit: Payer: Self-pay | Admitting: Neurology

## 2019-03-09 ENCOUNTER — Telehealth: Payer: Self-pay | Admitting: Neurology

## 2019-03-09 MED ORDER — LAMOTRIGINE 150 MG PO TABS: 150.0000 mg | ORAL_TABLET | Freq: Two times a day (BID) | ORAL | 0 refills | Status: DC

## 2019-03-09 MED ORDER — LAMOTRIGINE 150 MG PO TABS: 150.0000 mg | ORAL_TABLET | Freq: Two times a day (BID) | ORAL | 1 refills | Status: DC

## 2019-03-09 NOTE — Telephone Encounter (Signed)
I called the caretaker at [facility] group home, Maralyn Sago.  The patient was in the ER yesterday with seizures, the most recent Lamictal level was 3.5 on 13 February 2019.  The patient has had 3 seizures since January.  The patient is on 100 mg of Lamictal in the morning and 150 mg in the evening, will go to 150 mg twice daily, I will send in a new prescription.  The Lamictal likely needs to be gradually increased to at least 200 mg twice daily, the patient was recently placed on Trileptal 150 mg twice daily, in the ER his sodium level already is starting to drop a little bit, slightly low.  This will need to be followed.

## 2019-03-10 NOTE — Telephone Encounter (Signed)
Noted. TY. Nicholas Shepherd. Can we see if we can set up a virtual appt with patient and Maralyn Sago at group home to check in on seizures in about 2 weeks.

## 2019-03-13 ENCOUNTER — Other Ambulatory Visit: Payer: Self-pay

## 2019-03-13 ENCOUNTER — Emergency Department (HOSPITAL_COMMUNITY)
Admission: EM | Admit: 2019-03-13 | Discharge: 2019-03-14 | Disposition: A | Discharge status: Home / Self Care | Payer: Medicare Other | Attending: Emergency Medicine | Admitting: Emergency Medicine

## 2019-03-13 ENCOUNTER — Encounter (HOSPITAL_COMMUNITY): Payer: Self-pay | Admitting: Family Medicine

## 2019-03-13 DIAGNOSIS — F840 Autistic disorder: Secondary | ICD-10-CM | POA: Insufficient documentation

## 2019-03-13 DIAGNOSIS — F79 Unspecified intellectual disabilities: Secondary | ICD-10-CM | POA: Insufficient documentation

## 2019-03-13 DIAGNOSIS — R569 Unspecified convulsions: Secondary | ICD-10-CM | POA: Diagnosis present

## 2019-03-13 DIAGNOSIS — Z79899 Other long term (current) drug therapy: Secondary | ICD-10-CM | POA: Diagnosis not present

## 2019-03-13 LAB — COMPREHENSIVE METABOLIC PANEL
ALT: 49 U/L — ABNORMAL HIGH (ref 0–44)
AST: 32 U/L (ref 15–41)
Albumin: 4.6 g/dL (ref 3.5–5.0)
Alkaline Phosphatase: 116 U/L (ref 38–126)
Anion gap: 11 (ref 5–15)
BUN: 12 mg/dL (ref 6–20)
CO2: 27 mmol/L (ref 22–32)
Calcium: 9.8 mg/dL (ref 8.9–10.3)
Chloride: 97 mmol/L — ABNORMAL LOW (ref 98–111)
Creatinine, Ser: 0.81 mg/dL (ref 0.61–1.24)
GFR calc Af Amer: >60 mL/min (ref >60)
GFR calc non Af Amer: >60 mL/min (ref >60)
Glucose, Bld: 102 mg/dL — ABNORMAL HIGH (ref 70–99)
Potassium: 4.2 mmol/L (ref 3.5–5.1)
Sodium: 135 mmol/L (ref 135–145)
Total Bilirubin: 0.4 mg/dL (ref 0.3–1.2)
Total Protein: 7.5 g/dL (ref 6.5–8.1)

## 2019-03-13 LAB — CBC WITH DIFFERENTIAL/PLATELET
Abs Immature Granulocytes: 0.13 10*3/uL — ABNORMAL HIGH (ref 0.00–0.07)
Basophils Absolute: 0 10*3/uL (ref 0.0–0.1)
Basophils Relative: 0 %
Eosinophils Absolute: 0 10*3/uL (ref 0.0–0.5)
Eosinophils Relative: 0 %
HCT: 45.1 % (ref 39.0–52.0)
Hemoglobin: 14.9 g/dL (ref 13.0–17.0)
Immature Granulocytes: 1 %
Lymphocytes Relative: 16 %
Lymphs Abs: 2.2 10*3/uL (ref 0.7–4.0)
MCH: 27.6 pg (ref 26.0–34.0)
MCHC: 33 g/dL (ref 30.0–36.0)
MCV: 83.5 fL (ref 80.0–100.0)
Monocytes Absolute: 0.9 10*3/uL (ref 0.1–1.0)
Monocytes Relative: 7 %
Neutro Abs: 10.2 10*3/uL — ABNORMAL HIGH (ref 1.7–7.7)
Neutrophils Relative %: 76 %
Platelets: 309 10*3/uL (ref 150–400)
RBC: 5.4 MIL/uL (ref 4.22–5.81)
RDW: 13.6 % (ref 11.5–15.5)
WBC: 13.5 10*3/uL — ABNORMAL HIGH (ref 4.0–10.5)
nRBC: 0 % (ref 0.0–0.2)

## 2019-03-13 LAB — LAMOTRIGINE LEVEL: Lamotrigine Lvl: 3.9 ug/mL (ref 2.0–20.0)

## 2019-03-13 NOTE — Discharge Instructions (Signed)
Please follow up with Nicholas Shepherd's neurologist Return if worsening

## 2019-03-13 NOTE — Telephone Encounter (Signed)
We can use CPAP slot if Nicholas Shepherd is available at that time. We can also push appt to 2-4 weeks if that helps. Please make sure he is doing well on increased dose of lamotrigine. TY!

## 2019-03-13 NOTE — ED Notes (Signed)
PT non-verbal, cooperative for vitals and EKG. Pt does not appear to be in any distress or pain at this time. PT resting in bed. Seizure pads in place.

## 2019-03-13 NOTE — Telephone Encounter (Signed)
I called and spoke to Nicholas Shepherd.  I made appt for [facility] DOXY.ME 03-23-19 at 0745 with Nicholas Shepherd and Nicholas Shepherd.  No seizures since increase of lamotrigine 150mg  po bid.

## 2019-03-13 NOTE — ED Notes (Signed)
[facility] called paperwork printed.  

## 2019-03-13 NOTE — ED Notes (Signed)
Per Sarah RN at group home no one is available to transport at night and that [facility] is the best option to transport. Provided discharge instruction and follow up instruction to Sarah.

## 2019-03-13 NOTE — ED Notes (Signed)
Spoke to PT mother, Victorino Dike, informed her we are waiting on more blood work to come back and we will be able to determine if he can be discharged. She instructed to call sara at group home for ride back.

## 2019-03-13 NOTE — ED Provider Notes (Signed)
[facility]-EMERGENCY DEPT Provider Note   CSN: [id number] Arrival date & time: 03/13/19  2024   History Chief Complaint  Patient presents with  . Seizures    Nicholas Shepherd is a 19 y.o. male with history of autism, seizure d/o, aggressive behavior who presents with a seizure. Pt is non-verbal and cannot provide any history. He has been to the ED several times over the past couple months for a seizure at his group home. He is currently under the care of [facility]. He is on Lamictal 150mg  BID and oxcarbazepine 150mg  BID. Per chart review the plan is to increase his Lamictal dose to 200mg  BID but this has not happened yet. He had a seizure that lasted 5 min at his group home. I discussed with the RN at the group home, , who states that the patient has been well. He has not had a fever, cough, vomiting or any other unusual symptoms recently.   LEVEL 5 caveat due to pt being non-verbal  HPI     Past Medical History:  Diagnosis Date  . Anxiety   . Autism disorder    severe per legal gaurdian  . Depression   . Nonverbal   . Otitis media   . Seizures ([facility]) 11/09/2017, 03/2018   most recent seizure activity on 04/28/18 x 4 minutes    Patient Active Problem List   Diagnosis Date Noted  . Seizure disorder ([facility]) 02/03/2019  . DMDD (disruptive mood dysregulation disorder) ([facility]) 11/06/2017  . Autism spectrum disorder 11/06/2017  . Intellectual disability 11/06/2017  . Aggressive behavior 06/09/2017    History reviewed. No pertinent surgical history.     Family History  Problem Relation Age of Onset  . Seizures Mother        epilepsy as a child    Social History   Tobacco Use  . Smoking status: Never Smoker  . Smokeless tobacco: Never Used  Substance Use Topics  . Alcohol use: No  . Drug use: No    Home Medications Prior to Admission medications   Medication Sig Start Date End Date Taking? Authorizing Provider  Aloe Vera GEL Apply 1  application topically 2 (two) times daily as needed (wound).    [provider]  aluminum-magnesium hydroxide 200-200 MG/5ML suspension Take 30 mLs by mouth every 6 (six) hours as needed for indigestion.    [provider]  benztropine (COGENTIN) 1 MG tablet Take 1 mg by mouth daily.    [provider]  bisacodyl (DULCOLAX) 10 MG suppository Place 10 mg rectally daily as needed for moderate constipation.    [provider]  carbamide peroxide (DEBROX) 6.5 % OTIC solution Place 4 drops into both ears 2 (two) times daily as needed (wax buildup).    [provider]  clomiPRAMINE (ANAFRANIL) 50 MG capsule Take 100 mg by mouth 2 (two) times daily.    [provider]  cloNIDine (CATAPRES) 0.2 MG tablet Take 0.2 mg by mouth 2 (two) times daily.  05/20/17   [provider]  Ensure Plus (ENSURE PLUS) LIQD Take 237 mLs by mouth daily as needed (nutrition).    [provider]  guaiFENesin (ROBITUSSIN) 100 MG/5ML liquid Take 200 mg by mouth 4 (four) times daily as needed for cough.    [provider]  haloperidol (HALDOL) 10 MG tablet Take 1 tablet (10 mg total) by mouth at bedtime. Patient not taking: Reported on 03/08/2019 02/04/19   07/20/17, FNP  haloperidol (HALDOL) 10 MG tablet Take 10 mg by mouth 2 (two) times daily.    [provider]  hydrocortisone (ANUSOL-HC) 2.5 % rectal cream Place 1 application rectally 4 (four) times daily as needed for hemorrhoids or anal itching.    [provider]  hydrocortisone cream 1 % Apply 1 application topically 2 (two) times daily as needed for itching.    [provider]  hydrOXYzine (VISTARIL) 50 MG capsule Take 50 mg by mouth 3 (three) times daily. 01/13/19   [provider]  ibuprofen (ADVIL) 400 MG tablet Take 800 mg by mouth every 6 (six) hours as needed for moderate pain.    [provider]  lamoTRIgine (LAMICTAL) 150 MG tablet Take 1  tablet (150 mg total) by mouth 2 (two) times daily. 03/09/19   York Spaniel, MD  loperamide (IMODIUM) 2 MG capsule Take 4 mg by mouth as needed for diarrhea or loose stools.    [provider]  loratadine (CLARITIN) 10 MG tablet Take 10 mg by mouth daily as needed for allergies.    [provider]  magnesium hydroxide (MILK OF MAGNESIA) 400 MG/5ML suspension Take 30 mLs by mouth daily as needed for mild constipation.    [provider]  Melatonin 10 MG CAPS Take 1 capsule by mouth at bedtime.    [provider]  Multiple Vitamins-Minerals (MULTIVITAMIN ADULT PO) Take 1 tablet by mouth daily.    [provider]  OXcarbazepine (TRILEPTAL) 150 MG tablet Take 1 tablet (150 mg total) by mouth 2 (two) times daily. 02/23/19   Lomax, Amy, NP  phenol (CHLORASEPTIC) 1.4 % LIQD Use as directed 3 sprays in the mouth or throat every 2 (two) hours as needed for throat irritation / pain.    [provider]  QUEtiapine (SEROQUEL) 100 MG tablet Take 100 mg by mouth at bedtime.    [provider]  Skin Protectants, Misc. (EUCERIN) cream Apply 1 application topically daily as needed for dry skin.    [provider]  sodium phosphate (FLEET) 7-19 GM/118ML ENEM Place 1 enema rectally daily as needed for moderate constipation or severe constipation.    [provider]  terbinafine (LAMISIL) 1 % cream Apply 1 application topically 2 (two) times daily as needed (itching).    [provider]  traZODone (DESYREL) 100 MG tablet Take 100 mg by mouth at bedtime.     [provider]    Allergies    Ativan [lorazepam], Depakote [divalproex sodium], and Vyvanse [lisdexamfetamine]  Review of Systems   Review of Systems  Unable to perform ROS: Patient nonverbal    Physical Exam Updated Vital Signs BP 126/67   Pulse 98   Temp 98.4 F (36.9 C) (Oral)   Resp 16   SpO2 96%   Physical Exam Vitals and nursing note reviewed.   Constitutional:      General: He is not in acute distress.    Appearance: Normal appearance. He is well-developed. He is not ill-appearing.     Comments: Alert. Responds to voice. Pulls the blankets over his head when I talk to him  HENT:     Head: Normocephalic and atraumatic.  Eyes:     General: No scleral icterus.       Right eye: No discharge.        Left eye: No discharge.     Conjunctiva/sclera: Conjunctivae normal.     Pupils: Pupils are equal, round, and reactive to light.  Cardiovascular:  Rate and Rhythm: Normal rate and regular rhythm.  Pulmonary:     Effort: Pulmonary effort is normal. No respiratory distress.     Breath sounds: Normal breath sounds.  Abdominal:     General: There is no distension.  Musculoskeletal:     Cervical back: Normal range of motion.  Skin:    General: Skin is warm and dry.  Neurological:     Mental Status: He is alert.     Comments: Unable to participate  Psychiatric:        Behavior: Behavior normal.     ED Results / Procedures / Treatments   Labs (all labs ordered are listed, but only abnormal results are displayed) Labs Reviewed  COMPREHENSIVE METABOLIC PANEL - Abnormal; Notable for the following components:      Result Value   Chloride 97 (*)    Glucose, Bld 102 (*)    ALT 49 (*)    All other components within normal limits  CBC WITH DIFFERENTIAL/PLATELET - Abnormal; Notable for the following components:   WBC 13.5 (*)    Neutro Abs 10.2 (*)    Abs Immature Granulocytes 0.13 (*)    All other components within normal limits  LAMOTRIGINE LEVEL  10-HYDROXYCARBAZEPINE    EKG None  Radiology No results found.  Procedures Procedures (including critical care time)  Medications Ordered in ED Medications - No data to display  ED Course  I have reviewed the triage vital signs and the nursing notes.  Pertinent labs & imaging results that were available during my care of the patient were reviewed by me and considered  in my medical decision making (see chart for details).  19 year old male with history of seizures and autism presents with seizure at his group home.  Vital signs are reassuring.  Patient seems to be at his baseline.  He is awake and alert, moving all extremities.  Per chart review seizures have been somewhat uncontrolled and his neurologist is working on titrating his dose to a therapeutic level.  Labs were obtained showed mild leukocytosis.  Per discussion with Sarah the RN at the group home she states that patient has been acting normally and they have no concerns for any infectious etiology.  This is likely related to his seizure today. Attempted to call his mother who is his legal gaurdian who did not answer but I was able to leave a message. Apparently the nurses were able to talk to her and update her. Pt has been observed for 3 hours and has had no further seizure activity and has remained at his baseline. Lamictal and Oxcarbazepine levels were obtained prior to d/c.  MDM Rules/Calculators/A&P                       Final Clinical Impression(s) / ED Diagnoses Final diagnoses:  Seizure [facility])    Rx / [place] Orders ED Discharge Orders    None       Recardo Evangelist, PA-C 03/14/19 1825    Daleen Bo, MD 03/15/19 [phone]

## 2019-03-13 NOTE — ED Triage Notes (Signed)
Patient is from [facility] and transported via [facility]. Patient had a witnessed 5 min seizure with jerking and shaking involved. Denies any previous or post injury. During transport, patient stayed at baseline, alert, oriented to self, and follow commands. Recent change in medications.

## 2019-03-14 NOTE — ED Notes (Signed)
Pt non-verbal at baseline. PT ambulatory and not in distress at time of discharge.

## 2019-03-15 LAB — LAMOTRIGINE LEVEL: Lamotrigine Lvl: 3.3 ug/mL (ref 2.0–20.0)

## 2019-03-16 LAB — 10-HYDROXYCARBAZEPINE: Triliptal/MTB(Oxcarbazepin): 4 ug/mL — ABNORMAL LOW (ref 10–35)

## 2019-03-20 ENCOUNTER — Telehealth: Payer: Self-pay | Admitting: *Deleted

## 2019-03-20 ENCOUNTER — Telehealth: Payer: Self-pay | Admitting: Family Medicine

## 2019-03-20 MED ORDER — OXCARBAZEPINE 150 MG PO TABS: ORAL_TABLET | ORAL | 0 refills | Status: DC

## 2019-03-20 NOTE — Telephone Encounter (Signed)
-----   Message from Amy Lomax, NP sent at 03/16/2019  4:26 PM EST ----- Please let group home know that carbamazepine levels were low. I would like to increase oxcarbamazepine to 300mg in am and 150mg in pm for the next week. I will see them on 3/4 for follow up with anticipation of increasing dose to 300mg twice daily. TY.  

## 2019-03-20 NOTE — Telephone Encounter (Signed)
I spoke to Nicholas Shepherd this am at group home.  Relayed that carbamazepine level low.  Increase  oxcarbamazepine to 300mg  po am and 150mg  po pm, will see 03-23-19 for appt.  took verbal order.  She verbalized understanding.

## 2019-03-20 NOTE — Telephone Encounter (Signed)
-----   Message from Shawnie Dapper, NP sent at 03/16/2019  4:26 PM EST ----- Please let group home know that carbamazepine levels were low. I would like to increase oxcarbamazepine to 300mg  in am and 150mg  in pm for the next week. I will see them on 3/4 for follow up with anticipation of increasing dose to 300mg  twice daily. TY.

## 2019-03-20 NOTE — Telephone Encounter (Signed)
Sarah from the group home [facility] called stating that they are needing the changes that are being made on the pt's OXcarbazepine (TRILEPTAL) 150 MG tablet actually sent [facility] [place] due to verbal orders not being accepted. She states that it was to be 300mg  in the AM and 150mg  in the PM. Please advise.

## 2019-03-23 ENCOUNTER — Encounter (HOSPITAL_COMMUNITY): Payer: Self-pay | Admitting: Emergency Medicine

## 2019-03-23 ENCOUNTER — Encounter: Payer: Self-pay | Admitting: Family Medicine

## 2019-03-23 ENCOUNTER — Other Ambulatory Visit: Payer: Self-pay

## 2019-03-23 ENCOUNTER — Emergency Department (HOSPITAL_COMMUNITY)
Admission: EM | Admit: 2019-03-23 | Discharge: 2019-03-24 | Disposition: A | Discharge status: Home / Self Care | Payer: Medicare Other | Attending: Emergency Medicine | Admitting: Emergency Medicine

## 2019-03-23 ENCOUNTER — Ambulatory Visit (INDEPENDENT_AMBULATORY_CARE_PROVIDER_SITE_OTHER): Payer: Medicare Other | Admitting: Family Medicine

## 2019-03-23 DIAGNOSIS — G40909 Epilepsy, unspecified, not intractable, without status epilepticus: Secondary | ICD-10-CM | POA: Diagnosis not present

## 2019-03-23 DIAGNOSIS — F3481 Disruptive mood dysregulation disorder: Secondary | ICD-10-CM | POA: Diagnosis not present

## 2019-03-23 DIAGNOSIS — F79 Unspecified intellectual disabilities: Secondary | ICD-10-CM | POA: Insufficient documentation

## 2019-03-23 DIAGNOSIS — F840 Autistic disorder: Secondary | ICD-10-CM | POA: Insufficient documentation

## 2019-03-23 DIAGNOSIS — R4689 Other symptoms and signs involving appearance and behavior: Secondary | ICD-10-CM | POA: Diagnosis present

## 2019-03-23 DIAGNOSIS — Z79899 Other long term (current) drug therapy: Secondary | ICD-10-CM | POA: Insufficient documentation

## 2019-03-23 DIAGNOSIS — F329 Major depressive disorder, single episode, unspecified: Secondary | ICD-10-CM | POA: Diagnosis not present

## 2019-03-23 LAB — CBC WITH DIFFERENTIAL/PLATELET
Abs Immature Granulocytes: 0.05 10*3/uL (ref 0.00–0.07)
Basophils Absolute: 0 10*3/uL (ref 0.0–0.1)
Basophils Relative: 0 %
Eosinophils Absolute: 0 10*3/uL (ref 0.0–0.5)
Eosinophils Relative: 0 %
HCT: 44.8 % (ref 39.0–52.0)
Hemoglobin: 14.7 g/dL (ref 13.0–17.0)
Immature Granulocytes: 0 %
Lymphocytes Relative: 21 %
Lymphs Abs: 2.6 10*3/uL (ref 0.7–4.0)
MCH: 27.7 pg (ref 26.0–34.0)
MCHC: 32.8 g/dL (ref 30.0–36.0)
MCV: 84.5 fL (ref 80.0–100.0)
Monocytes Absolute: 0.9 10*3/uL (ref 0.1–1.0)
Monocytes Relative: 7 %
Neutro Abs: 8.8 10*3/uL — ABNORMAL HIGH (ref 1.7–7.7)
Neutrophils Relative %: 72 %
Platelets: 300 10*3/uL (ref 150–400)
RBC: 5.3 MIL/uL (ref 4.22–5.81)
RDW: 13.2 % (ref 11.5–15.5)
WBC: 12.5 10*3/uL — ABNORMAL HIGH (ref 4.0–10.5)
nRBC: 0 % (ref 0.0–0.2)

## 2019-03-23 LAB — BASIC METABOLIC PANEL
Anion gap: 8 (ref 5–15)
BUN: 10 mg/dL (ref 6–20)
CO2: 26 mmol/L (ref 22–32)
Calcium: 9.3 mg/dL (ref 8.9–10.3)
Chloride: 100 mmol/L (ref 98–111)
Creatinine, Ser: 0.94 mg/dL (ref 0.61–1.24)
GFR calc Af Amer: >60 mL/min (ref >60)
GFR calc non Af Amer: >60 mL/min (ref >60)
Glucose, Bld: 89 mg/dL (ref 70–99)
Potassium: 4.9 mmol/L (ref 3.5–5.1)
Sodium: 134 mmol/L — ABNORMAL LOW (ref 135–145)

## 2019-03-23 LAB — SALICYLATE LEVEL: Salicylate Lvl: <7 mg/dL — ABNORMAL LOW (ref 7.0–30.0)

## 2019-03-23 LAB — ACETAMINOPHEN LEVEL: Acetaminophen (Tylenol), Serum: <10 ug/mL — ABNORMAL LOW (ref 10–30)

## 2019-03-23 LAB — ETHANOL: Alcohol, Ethyl (B): <10 mg/dL (ref <10)

## 2019-03-23 LAB — CARBAMAZEPINE LEVEL, TOTAL: Carbamazepine Lvl: <2 ug/mL — ABNORMAL LOW (ref 4.0–12.0)

## 2019-03-23 MED ORDER — ZOLPIDEM TARTRATE 10 MG PO TABS
10.0000 mg | ORAL_TABLET | Freq: Every day | ORAL | Status: DC
  Administered 2019-03-23: 21:00:00 10 mg via ORAL
  Filled 2019-03-23: qty 1

## 2019-03-23 MED ORDER — STERILE WATER FOR INJECTION IJ SOLN
INTRAMUSCULAR | Status: AC
  Administered 2019-03-23: 1.2 mL
  Filled 2019-03-23: qty 10

## 2019-03-23 MED ORDER — LAMOTRIGINE 25 MG PO TABS
150.0000 mg | ORAL_TABLET | Freq: Two times a day (BID) | ORAL | Status: DC
  Administered 2019-03-23 – 2019-03-24 (×2): 150 mg via ORAL
  Filled 2019-03-23 (×2): qty 2

## 2019-03-23 MED ORDER — ZIPRASIDONE MESYLATE 20 MG IM SOLR
10.0000 mg | Freq: Once | INTRAMUSCULAR | Status: AC
  Administered 2019-03-23: 10 mg via INTRAMUSCULAR
  Filled 2019-03-23: qty 20

## 2019-03-23 MED ORDER — CLOMIPRAMINE HCL 25 MG PO CAPS
100.0000 mg | ORAL_CAPSULE | Freq: Two times a day (BID) | ORAL | Status: DC
  Administered 2019-03-23 – 2019-03-24 (×2): 100 mg via ORAL
  Filled 2019-03-23 (×3): qty 4

## 2019-03-23 MED ORDER — HYDROXYZINE PAMOATE 50 MG PO CAPS: 50.0000 mg | ORAL_CAPSULE | Freq: Three times a day (TID) | ORAL | Status: DC

## 2019-03-23 MED ORDER — MELATONIN 5 MG PO TABS
5.0000 mg | ORAL_TABLET | Freq: Every day | ORAL | Status: DC
  Administered 2019-03-23: 21:00:00 5 mg via ORAL
  Filled 2019-03-23: qty 1

## 2019-03-23 MED ORDER — ACETAMINOPHEN 325 MG PO TABS: 650.0000 mg | ORAL_TABLET | ORAL | Status: DC | PRN

## 2019-03-23 MED ORDER — IBUPROFEN 800 MG PO TABS: 800.0000 mg | ORAL_TABLET | ORAL | Status: DC | PRN

## 2019-03-23 MED ORDER — OXCARBAZEPINE 150 MG PO TABS: ORAL_TABLET | ORAL | 5 refills | Status: DC

## 2019-03-23 MED ORDER — OXCARBAZEPINE 150 MG PO TABS
150.0000 mg | ORAL_TABLET | Freq: Two times a day (BID) | ORAL | Status: DC
  Administered 2019-03-23 – 2019-03-24 (×2): 150 mg via ORAL
  Filled 2019-03-23 (×2): qty 1

## 2019-03-23 MED ORDER — HALOPERIDOL 5 MG PO TABS
10.0000 mg | ORAL_TABLET | Freq: Two times a day (BID) | ORAL | Status: DC
  Administered 2019-03-23 – 2019-03-24 (×2): 10 mg via ORAL
  Filled 2019-03-23 (×2): qty 2

## 2019-03-23 MED ORDER — BENZTROPINE MESYLATE 1 MG PO TABS
1.0000 mg | ORAL_TABLET | Freq: Every day | ORAL | Status: DC
  Administered 2019-03-23 – 2019-03-24 (×2): 1 mg via ORAL
  Filled 2019-03-23 (×2): qty 1

## 2019-03-23 MED ORDER — QUETIAPINE FUMARATE 100 MG PO TABS
100.0000 mg | ORAL_TABLET | Freq: Every day | ORAL | Status: DC
  Administered 2019-03-23: 100 mg via ORAL
  Filled 2019-03-23: qty 1

## 2019-03-23 MED ORDER — BISACODYL 10 MG RE SUPP
10.0000 mg | RECTAL | Status: DC | PRN
  Filled 2019-03-23: qty 1

## 2019-03-23 MED ORDER — CLONIDINE HCL 0.1 MG PO TABS
0.2000 mg | ORAL_TABLET | Freq: Two times a day (BID) | ORAL | Status: DC
  Administered 2019-03-23 – 2019-03-24 (×2): 0.2 mg via ORAL
  Filled 2019-03-23 (×2): qty 2

## 2019-03-23 MED ORDER — HYDROXYZINE HCL 25 MG PO TABS
50.0000 mg | ORAL_TABLET | Freq: Three times a day (TID) | ORAL | Status: DC
  Administered 2019-03-23 – 2019-03-24 (×2): 50 mg via ORAL
  Filled 2019-03-23 (×2): qty 2

## 2019-03-23 NOTE — ED Notes (Signed)
Pt still refusing bloodwork and urine sample at this time.

## 2019-03-23 NOTE — Progress Notes (Signed)
PATIENT: Nicholas Shepherd DOB: 09-24-2000  REASON FOR VISIT: follow up HISTORY FROM: patient  Virtual Visit via Telephone Note  I connected with Jakhai I Armstrong on 03/23/19 at  7:45 AM EST by telephone and verified that I am speaking with the correct person using two identifiers.   I discussed the limitations, risks, security and privacy concerns of performing an evaluation and management service by telephone and the availability of in person appointments. I also discussed with the patient that there may be a patient responsible charge related to this service. The patient expressed understanding and agreed to proceed.   History of Present Illness:  03/23/19 OWENS HARA is a 19 y.o. male here today for follow up for seizure. We  increased lamotrigine to 150 mg twice daily mid-February following 2 prolonged seizures requiring EMS transport to the hospital.  [name], group home caregiver, aids in history today.  She reports that he has had 2 smaller seizures since increasing lamotrigine.  Last ER visit revealed suboptimal ox carbamazepine levels.  Orders were sent to increase oxcarbamazepine to 300 mg in the morning and 150 mg in the afternoon for 2 weeks then increase to 300 mg twice daily.  [name] states that they have just received that medication today.  They will initiate increased dose today.  Otherwise he is doing well.  He continues to follow psychiatry closely.  See reports that he anticipates changing to a different group home next week.  She will relay all information including dose changes in medications with transfer.   Observations/Objective:  Unable to assess due to nature of visit    Assessment and Plan:  19 y.o. year old male  has a past medical history of Anxiety, Autism disorder, Depression, Nonverbal, Otitis media, and Seizures ([place]) (11/09/2017, 03/2018). here with    ICD-10-CM   1. Seizure disorder ([place])  G40.909     Unfortunately, [name] has continued to have  seizure activity.  Lamotrigine was increased to 150 mg twice daily about 2 to 3 weeks ago.  [name] will ensure that oxcarbamazepine dose is increased starting today.  He will start 300 mg in the a.m. and 150 mg in the p.m. for 2 weeks then increase to 300 mg twice daily.  [name] will ensure that this message is related to the new group home when transferred next week.  I have scheduled a follow-up with me in 4 weeks.  [name] verbalizes understanding and agreement with this plan and will call with any new or worsening symptoms.  No orders of the defined types were placed in this encounter.   Meds ordered this encounter  Medications  . OXcarbazepine (TRILEPTAL) 150 MG tablet    Sig: Takes 300mg  by mouth in AM and 150mg  by mouth in PM    Dispense:  120 tablet    Refill:  5    Take 2 tablets twice daily starting 04/06/2019    Order Specific Question:   Supervising Provider    Answer:   Melvenia Beam [[id number]]     Follow Up Instructions:  I discussed the assessment and treatment plan with the patient. The patient was provided an opportunity to ask questions and all were answered. The patient agreed with the plan and demonstrated an understanding of the instructions.   The patient was advised to call back or seek an in-person evaluation if the symptoms worsen or if the condition fails to improve as anticipated.  I provided 15 minutes of non-face-to-face time during this  encounter.  Sarah and patient are located at patient's group home during.C.me virtual visit.  Providers in the office.   Shawnie Dapper, NP

## 2019-03-23 NOTE — ED Provider Notes (Signed)
[facility]-EMERGENCY DEPT Provider Note   CSN: [id number] Arrival date & time: 03/23/19  1240     History Chief Complaint  Patient presents with  . IVC    Nicholas Shepherd is a 19 y.o. male.  HPI    Patient is a 19 year old male with a history of anxiety, autism, depression, nonverbal, seizures, who presents the emergency department today under IVC for evaluation of aggressive behavior.  There is a level 5 caveat as patient is nonverbal at baseline and is unable to provide any significant history.  1:20 PM I spoke with Doretha Imus, who is employed by his group home.  She states that she was contacted by group home staff and was told that the patient was becoming aggressive and attacking staff.  He was also hitting walls, throwing things and broke a window by throwing something through it.  She states that patient can only say very few words but he can understand what is being said and can follow instructions. He has otherwise been in his normal state of health.   Past Medical History:  Diagnosis Date  . Anxiety   . Autism disorder    severe per legal gaurdian  . Depression   . Nonverbal   . Otitis media   . Seizures (HCC) 11/09/2017, 03/2018   most recent seizure activity on 04/28/18 x 4 minutes    Patient Active Problem List   Diagnosis Date Noted  . Seizure disorder (HCC) 02/03/2019  . DMDD (disruptive mood dysregulation disorder) (HCC) 11/06/2017  . Autism spectrum disorder 11/06/2017  . Intellectual disability 11/06/2017  . Aggressive behavior 06/09/2017    History reviewed. No pertinent surgical history.     Family History  Problem Relation Age of Onset  . Seizures Mother        epilepsy as a child    Social History   Tobacco Use  . Smoking status: Never Smoker  . Smokeless tobacco: Never Used  Substance Use Topics  . Alcohol use: No  . Drug use: No    Home Medications Prior to Admission medications   Medication Sig Start Date  End Date Taking? Authorizing Provider  acetaminophen (TYLENOL) 325 MG tablet Take 650 mg by mouth every 4 (four) hours as needed for mild pain, fever or headache.    Yes [provider]  Aloe Vera GEL Apply 1 application topically 2 (two) times daily as needed (wound).   Yes [provider]  benztropine (COGENTIN) 1 MG tablet Take 1 mg by mouth daily.   Yes [provider]  bisacodyl (DULCOLAX) 10 MG suppository Place 10 mg rectally as needed for moderate constipation.    Yes [provider]  carbamide peroxide (DEBROX) 6.5 % OTIC solution Place 4 drops into both ears as needed (wax buildup).    Yes [provider]  clomiPRAMINE (ANAFRANIL) 50 MG capsule Take 100 mg by mouth 2 (two) times daily.   Yes [provider]  cloNIDine (CATAPRES) 0.2 MG tablet Take 0.2 mg by mouth 2 (two) times daily.  05/20/17  Yes [provider]  Ensure Plus (ENSURE PLUS) LIQD Take 237 mLs by mouth as needed (nutrition).    Yes [provider]  guaiFENesin (ROBITUSSIN) 100 MG/5ML liquid Take 200 mg by mouth every 6 (six) hours as needed.    Yes [provider]  haloperidol (HALDOL) 10 MG tablet Take 10 mg by mouth 2 (two) times daily.   Yes [provider]  hydrocortisone (ANUSOL-HC)  2.5 % rectal cream Place 1 application rectally as needed for hemorrhoids or anal itching (up to 5 times daily).    Yes [provider]  hydrocortisone cream 1 % Apply 1 application topically 3 (three) times daily as needed for itching.    Yes [provider]  hydrOXYzine (VISTARIL) 50 MG capsule Take 50 mg by mouth 3 (three) times daily. 01/13/19  Yes [provider]  ibuprofen (ADVIL) 400 MG tablet Take 800 mg by mouth as needed for moderate pain.    Yes [provider]  lamoTRIgine (LAMICTAL) 100 MG tablet Take 150 mg by mouth in the morning and at bedtime. 03/22/19  Yes [provider]  loratadine (CLARITIN)  10 MG tablet Take 10 mg by mouth daily as needed for allergies.   Yes [provider]  magnesium hydroxide (MILK OF MAGNESIA) 400 MG/5ML suspension Take 30 mLs by mouth as needed for mild constipation.    Yes [provider]  Melatonin 5 MG TABS Take 5 mg by mouth at bedtime.   Yes [provider]  Multiple Vitamins-Minerals (MULTIVITAMIN ADULT PO) Take 1 tablet by mouth daily.   Yes [provider]  neomycin-bacitracin-polymyxin (NEOSPORIN) ointment Apply 1 application topically as needed for wound care.   Yes [provider]  OXcarbazepine (TRILEPTAL) 150 MG tablet Takes 300mg  by mouth in AM and 150mg  by mouth in PM Patient taking differently: Take 150 mg by mouth 2 (two) times daily.  03/23/19  Yes Lomax, Amy, NP  phenol (CHLORASEPTIC) 1.4 % LIQD Use as directed 3 sprays in the mouth or throat every 2 (two) hours as needed for throat irritation / pain.   Yes [provider]  QUEtiapine (SEROQUEL) 100 MG tablet Take 100 mg by mouth at bedtime.   Yes [provider]  Skin Protectants, Misc. (EUCERIN) cream Apply 1 application topically as needed for dry skin.    Yes [provider]  sodium phosphate (FLEET) 7-19 GM/118ML ENEM Place 1 enema rectally as needed for moderate constipation or severe constipation.    Yes [provider]  zolpidem (AMBIEN) 10 MG tablet Take 10 mg by mouth at bedtime.   Yes [provider]  haloperidol (HALDOL) 10 MG tablet Take 1 tablet (10 mg total) by mouth at bedtime. Patient not taking: Reported on 03/08/2019 02/04/19   03/10/2019, FNP  lamoTRIgine (LAMICTAL) 150 MG tablet Take 1 tablet (150 mg total) by mouth 2 (two) times daily. Patient not taking: Reported on 03/23/2019 03/09/19   05/23/2019, MD    Allergies    Ativan [lorazepam], Depakote [divalproex sodium], and Vyvanse [lisdexamfetamine]  Review of Systems   Review of Systems  Reason unable to perform ROS: nonverbal.     Physical Exam Updated Vital Signs BP 106/64 (BP Location: Right Arm)   Pulse 79   Temp 98.7 F (37.1 C) (Oral)   Resp 18   SpO2 99%   Physical Exam Constitutional:      General: He is not in acute distress.    Appearance: He is well-developed.  Eyes:     Conjunctiva/sclera: Conjunctivae normal.  Cardiovascular:     Rate and Rhythm: Normal rate and regular rhythm.  Pulmonary:     Effort: Pulmonary effort is normal.     Breath sounds: Normal breath sounds.  Abdominal:     Palpations: Abdomen is soft.     Tenderness: There is no guarding.  Skin:    General: Skin is warm and  dry.  Neurological:     Mental Status: He is alert and oriented to person, place, and time.  Psychiatric:     Comments: Nonverbal, alert, follows commands     ED Results / Procedures / Treatments   Labs (all labs ordered are listed, but only abnormal results are displayed) Labs Reviewed  CBC WITH DIFFERENTIAL/PLATELET - Abnormal; Notable for the following components:      Result Value   WBC 12.5 (*)    Neutro Abs 8.8 (*)    All other components within normal limits  BASIC METABOLIC PANEL - Abnormal; Notable for the following components:   Sodium 134 (*)    All other components within normal limits  ACETAMINOPHEN LEVEL - Abnormal; Notable for the following components:   Acetaminophen (Tylenol), Serum <10 (*)    All other components within normal limits  SALICYLATE LEVEL - Abnormal; Notable for the following components:   Salicylate Lvl <0.1 (*)    All other components within normal limits  CARBAMAZEPINE LEVEL, TOTAL - Abnormal; Notable for the following components:   Carbamazepine Lvl <2.0 (*)    All other components within normal limits  ETHANOL  RAPID URINE DRUG SCREEN, HOSP PERFORMED    EKG None  Radiology No results found.  Procedures Procedures (including critical care time)  CRITICAL CARE Performed by: Rodney Booze   Total critical care time: 32 minutes  Critical  care time was exclusive of separately billable procedures and treating other patients.  Critical care was necessary to treat or prevent imminent or life-threatening deterioration.  Critical care was time spent personally by me on the following activities: development of treatment plan with patient and/or surrogate as well as nursing, discussions with consultants, evaluation of patient's response to treatment, examination of patient, obtaining history from patient or surrogate, ordering and performing treatments and interventions, ordering and review of laboratory studies, ordering and review of radiographic studies, pulse oximetry and re-evaluation of patient's condition.   Medications Ordered in ED Medications  sterile water (preservative free) injection (has no administration in time range)  acetaminophen (TYLENOL) tablet 650 mg (has no administration in time range)  benztropine (COGENTIN) tablet 1 mg (has no administration in time range)  bisacodyl (DULCOLAX) suppository 10 mg (has no administration in time range)  clomiPRAMINE (ANAFRANIL) capsule 100 mg (has no administration in time range)  cloNIDine (CATAPRES) tablet 0.2 mg (has no administration in time range)  haloperidol (HALDOL) tablet 10 mg (has no administration in time range)  hydrOXYzine (VISTARIL) capsule 50 mg (has no administration in time range)  ibuprofen (ADVIL) tablet 800 mg (has no administration in time range)  lamoTRIgine (LAMICTAL) tablet 150 mg (has no administration in time range)  Melatonin TABS 5 mg (has no administration in time range)  OXcarbazepine (TRILEPTAL) tablet 150 mg (has no administration in time range)  QUEtiapine (SEROQUEL) tablet 100 mg (has no administration in time range)  zolpidem (AMBIEN) tablet 10 mg (has no administration in time range)  ziprasidone (GEODON) injection 10 mg (10 mg Intramuscular Given 03/23/19 1407)    ED Course  I have reviewed the triage vital signs and the nursing  notes.  Pertinent labs & imaging results that were available during my care of the patient were reviewed by me and considered in my medical decision making (see chart for details).    MDM Rules/Calculators/A&P                      19 year old male presenting for evaluation  under IVC for aggressive behavior that was observed at the group home prior to arrival.   1:50 PM I was informed by nursing staff that patient became aggressive and swung at the nurse when she was trying to obtain labs. Will order a dose of geodon.   Reviewed labs, CBC shows leukocytosis at 12.5, no anemia BMP is grossly unremarkable EtOH, acetaminophen, salicylate levels are within normal limits Carbamazepine level is low  Patient does not have any evidence of an acute medical condition at this time that would require further work-up or admission to the hospital.  He is appropriate for behavioral health assessment.  7:45 PM Reassessed pt. He is in no distress. He is coloring at a table with a tech.   At shift change, care transitioned to default provider pending TTS assessment.  The patient has been placed in psychiatric observation due to the need to provide a safe environment for the patient while obtaining psychiatric consultation and evaluation, as well as ongoing medical and medication management to treat the patient's condition.  The patient has been placed under full IVC at this time.   Final Clinical Impression(s) / ED Diagnoses Final diagnoses:  None    Rx / DC Orders ED Discharge Orders    None       Rayne Du 03/23/19 2038    Gerhard Munch, MD 03/24/19 [phone]

## 2019-03-23 NOTE — ED Notes (Signed)
Explained to patient that I needed bloodwork and he extended out his arm in agreement. When trying to wrap the tourniquet around his arm, he punched my upper arm. Security was present during entire encounter. This RN left the room and MD notified of refusal. MD paged for medication so we can reattempt at a later time.

## 2019-03-23 NOTE — ED Notes (Signed)
Crisis line number [phone] that is following the patient. Provided the number for TTS for any further questions or concerns.

## 2019-03-23 NOTE — ED Triage Notes (Signed)
Patient BIB [facility] with IVC paperwork. Reports group home stated patient was becoming aggressive. Patient calm and cooperative in triage. Hx autism. Patient is nonverbal.

## 2019-03-23 NOTE — ED Notes (Signed)
Mother, Hagan Vanauken called for an update. States she is the legal guardian. Phone number is [phone].

## 2019-03-24 ENCOUNTER — Other Ambulatory Visit: Payer: Self-pay

## 2019-03-24 ENCOUNTER — Encounter (HOSPITAL_COMMUNITY): Payer: Self-pay | Admitting: Emergency Medicine

## 2019-03-24 ENCOUNTER — Emergency Department (HOSPITAL_COMMUNITY)
Admission: EM | Admit: 2019-03-24 | Discharge: 2019-03-24 | Disposition: A | Discharge status: Home / Self Care | Payer: Medicare Other | Source: Home / Self Care | Attending: Emergency Medicine | Admitting: Emergency Medicine

## 2019-03-24 DIAGNOSIS — Z79899 Other long term (current) drug therapy: Secondary | ICD-10-CM | POA: Insufficient documentation

## 2019-03-24 DIAGNOSIS — R569 Unspecified convulsions: Secondary | ICD-10-CM | POA: Insufficient documentation

## 2019-03-24 DIAGNOSIS — F79 Unspecified intellectual disabilities: Secondary | ICD-10-CM | POA: Insufficient documentation

## 2019-03-24 DIAGNOSIS — F3481 Disruptive mood dysregulation disorder: Secondary | ICD-10-CM | POA: Diagnosis not present

## 2019-03-24 DIAGNOSIS — F840 Autistic disorder: Secondary | ICD-10-CM | POA: Insufficient documentation

## 2019-03-24 LAB — COMPREHENSIVE METABOLIC PANEL
ALT: 61 U/L — ABNORMAL HIGH (ref 0–44)
AST: 48 U/L — ABNORMAL HIGH (ref 15–41)
Albumin: 4.2 g/dL (ref 3.5–5.0)
Alkaline Phosphatase: 104 U/L (ref 38–126)
Anion gap: 8 (ref 5–15)
BUN: 7 mg/dL (ref 6–20)
CO2: 27 mmol/L (ref 22–32)
Calcium: 9 mg/dL (ref 8.9–10.3)
Chloride: 97 mmol/L — ABNORMAL LOW (ref 98–111)
Creatinine, Ser: 0.85 mg/dL (ref 0.61–1.24)
GFR calc Af Amer: >60 mL/min (ref >60)
GFR calc non Af Amer: >60 mL/min (ref >60)
Glucose, Bld: 110 mg/dL — ABNORMAL HIGH (ref 70–99)
Potassium: 4.2 mmol/L (ref 3.5–5.1)
Sodium: 132 mmol/L — ABNORMAL LOW (ref 135–145)
Total Bilirubin: 0.5 mg/dL (ref 0.3–1.2)
Total Protein: 6.8 g/dL (ref 6.5–8.1)

## 2019-03-24 LAB — CBG MONITORING, ED: Glucose-Capillary: 99 mg/dL (ref 70–99)

## 2019-03-24 LAB — CBC WITH DIFFERENTIAL/PLATELET
Abs Immature Granulocytes: 0.09 10*3/uL — ABNORMAL HIGH (ref 0.00–0.07)
Basophils Absolute: 0 10*3/uL (ref 0.0–0.1)
Basophils Relative: 0 %
Eosinophils Absolute: 0 10*3/uL (ref 0.0–0.5)
Eosinophils Relative: 0 %
HCT: 41.3 % (ref 39.0–52.0)
Hemoglobin: 13.6 g/dL (ref 13.0–17.0)
Immature Granulocytes: 1 %
Lymphocytes Relative: 15 %
Lymphs Abs: 1.5 10*3/uL (ref 0.7–4.0)
MCH: 27.4 pg (ref 26.0–34.0)
MCHC: 32.9 g/dL (ref 30.0–36.0)
MCV: 83.1 fL (ref 80.0–100.0)
Monocytes Absolute: 0.7 10*3/uL (ref 0.1–1.0)
Monocytes Relative: 7 %
Neutro Abs: 7.5 10*3/uL (ref 1.7–7.7)
Neutrophils Relative %: 77 %
Platelets: 316 10*3/uL (ref 150–400)
RBC: 4.97 MIL/uL (ref 4.22–5.81)
RDW: 13 % (ref 11.5–15.5)
WBC: 9.8 10*3/uL (ref 4.0–10.5)
nRBC: 0 % (ref 0.0–0.2)

## 2019-03-24 LAB — MAGNESIUM: Magnesium: 2 mg/dL (ref 1.7–2.4)

## 2019-03-24 MED ORDER — STERILE WATER FOR INJECTION IJ SOLN
INTRAMUSCULAR | Status: AC
  Administered 2019-03-24: 1.2 mL
  Filled 2019-03-24: qty 10

## 2019-03-24 MED ORDER — ZIPRASIDONE MESYLATE 20 MG IM SOLR
10.0000 mg | Freq: Once | INTRAMUSCULAR | Status: AC
  Administered 2019-03-24: 10 mg via INTRAMUSCULAR
  Filled 2019-03-24: qty 20

## 2019-03-24 NOTE — ED Triage Notes (Signed)
Per [facility] pt from group home for witnessed seizure that lasted around 5 minutes. Pt is non-verbal. Was cooperative and sleepy with EMS, did follow commands.  Vitals: 90HR,  18R, 96% on RA, CBG 98. 114/70.  Pt has 20g in left AC,

## 2019-03-24 NOTE — Discharge Instructions (Addendum)
Please increase the patient's oxcarbazepine to 300 mg in the morning as directed by his neurologist. Get help right away if: You have a seizure that: Lasts longer than 5 minutes. Is different than previous seizures. Leaves you unable to speak or use a part of your body. Makes it harder to breathe. You have: A seizure after a head injury. Multiple seizures in a row. Confusion or a severe headache right after a seizure. You do not wake up immediately after a seizure. You injure yourself during a seizure.

## 2019-03-24 NOTE — ED Notes (Signed)
Pt DC d off unit to home per provider. Pt alert, confused, cooperative, no s/s of distress. DC information given to caregiver. Belongings given to pt. Pt ambulatory off the unit, escorted by NT and MHT. Pt transported by caregiver.

## 2019-03-24 NOTE — Discharge Instructions (Signed)
For your behavioral health needs, you are advised to follow up with your regular outpatient providers. °

## 2019-03-24 NOTE — ED Notes (Signed)
Patient pulled off all lines, walked out of room. Patient was directed to bathroom, walked back to room. Baseball put on TV and given juice. Awake and alert at this time, steady gait with no assistance needed.

## 2019-03-24 NOTE — Progress Notes (Signed)
Unable to collect urine due to patient flushing collection cups down the toilet.

## 2019-03-24 NOTE — Progress Notes (Addendum)
12:00p RN spoke with patient's mother (legal guardian) to make her aware that patient is to return to the group home.  11:30a Patient to be picked up around 12:15p per [name].   10:41a CSW spoke with group home manager Eldridge Abrahams informing her that patient has been psych cleared and will need to return to the group home. [name] reports she needs to speak to the staff at the group home about transporting patient and will get back with CSW. Will continue to follow up.   Geralyn Corwin, LCSW Transitions of Care Department [facility] ED [phone]

## 2019-03-24 NOTE — BH Assessment (Signed)
[facility] Assessment Progress Note  Per [name], FNP, this pt does not require psychiatric hospitalization at this time.  Pt presents under IVC initiated by the operations manager at the facility where pt resides, which has been rescinded by Nelly Rout, MD.  Pt is to be discharged from [facility] with recommendation to continue treatment with his regular outpatient providers.  This has been included in pt's discharge instructions.  Pt's nurse, Waynetta Sandy, has been notified.  Doylene Canning, MA Triage Specialist [phone]

## 2019-03-24 NOTE — ED Provider Notes (Signed)
[name] Provider Note   CSN: [id number] Arrival date & time: 03/24/19  1517     History Chief Complaint  Patient presents with  . Seizures    Nicholas Shepherd is a 19 y.o. male sent in from his group home for witnessed seizure today.  Patient is nondermatomal in the cardiology.  History is gathered by telephone from the patient's group home staff nurse, Nicholas Shepherd.  She states that the patient had a tonic-clonic seizure lasting approximately 5 minutes today.  It was typical of his seizure pattern.  He is on Lamictal and oxcarbazepine.  He has been taking medications daily as directed.  Nicholas Shepherd states that since January 14 the patient has had 6 seizures.  Prior to that he did not have seizures for "a very long time."  He was seen by his neurologist yesterday recommended increasing his oxcarbazepine from 100 mg in the morning.  He had not yet done this.  Did not receive antiseizure medications.  He had no apparent injuries, he was not incontinent of stool or urine.  HPI     Past Medical History:  Diagnosis Date  . Anxiety   . Autism disorder    severe per legal gaurdian  . Depression   . Nonverbal   . Otitis media   . Seizures (HCC) 11/09/2017, 03/2018   most recent seizure activity on 04/28/18 x 4 minutes    Patient Active Problem List   Diagnosis Date Noted  . Seizure disorder (HCC) 02/03/2019  . DMDD (disruptive mood dysregulation disorder) (HCC) 11/06/2017  . Autism spectrum disorder 11/06/2017  . Intellectual disability 11/06/2017  . Aggressive behavior 06/09/2017    History reviewed. No pertinent surgical history.     Family History  Problem Relation Age of Onset  . Seizures Mother        epilepsy as a child    Social History   Tobacco Use  . Smoking status: Never Smoker  . Smokeless tobacco: Never Used  Substance Use Topics  . Alcohol use: No  . Drug use: No    Home Medications Prior to Admission medications     Medication Sig Start Date End Date Taking? Authorizing Provider  acetaminophen (TYLENOL) 325 MG tablet Take 650 mg by mouth every 4 (four) hours as needed for mild pain, fever or headache.     [provider]  Aloe Vera GEL Apply 1 application topically 2 (two) times daily as needed (wound).    [provider]  benztropine (COGENTIN) 1 MG tablet Take 1 mg by mouth daily.    [provider]  bisacodyl (DULCOLAX) 10 MG suppository Place 10 mg rectally as needed for moderate constipation.     [provider]  carbamide peroxide (DEBROX) 6.5 % OTIC solution Place 4 drops into both ears as needed (wax buildup).     [provider]  clomiPRAMINE (ANAFRANIL) 50 MG capsule Take 100 mg by mouth 2 (two) times daily.    [provider]  cloNIDine (CATAPRES) 0.2 MG tablet Take 0.2 mg by mouth 2 (two) times daily.  05/20/17   [provider]  Ensure Plus (ENSURE PLUS) LIQD Take 237 mLs by mouth as needed (nutrition).     [provider]  guaiFENesin (ROBITUSSIN) 100 MG/5ML liquid Take 200 mg by mouth every 6 (six) hours as needed.     [provider]  haloperidol (HALDOL) 10 MG tablet Take 10 mg by mouth 2 (two) times daily.  [provider]  hydrocortisone (ANUSOL-HC) 2.5 % rectal cream Place 1 application rectally as needed for hemorrhoids or anal itching (up to 5 times daily).     [provider]  hydrocortisone cream 1 % Apply 1 application topically 3 (three) times daily as needed for itching.     [provider]  hydrOXYzine (VISTARIL) 50 MG capsule Take 50 mg by mouth 3 (three) times daily. 01/13/19   [provider]  ibuprofen (ADVIL) 400 MG tablet Take 800 mg by mouth as needed for moderate pain.     [provider]  lamoTRIgine (LAMICTAL) 100 MG tablet Take 150 mg by mouth in the morning and at bedtime. 03/22/19   [provider]  loratadine (CLARITIN) 10 MG tablet Take  10 mg by mouth daily as needed for allergies.    [provider]  magnesium hydroxide (MILK OF MAGNESIA) 400 MG/5ML suspension Take 30 mLs by mouth as needed for mild constipation.     [provider]  Melatonin 5 MG TABS Take 5 mg by mouth at bedtime.    [provider]  Multiple Vitamins-Minerals (MULTIVITAMIN ADULT PO) Take 1 tablet by mouth daily.    [provider]  neomycin-bacitracin-polymyxin (NEOSPORIN) ointment Apply 1 application topically as needed for wound care.    [provider]  OXcarbazepine (TRILEPTAL) 150 MG tablet Takes 300mg  by mouth in AM and 150mg  by mouth in PM Patient taking differently: Take 150 mg by mouth 2 (two) times daily.  03/23/19   Lomax, Amy, NP  phenol (CHLORASEPTIC) 1.4 % LIQD Use as directed 3 sprays in the mouth or throat every 2 (two) hours as needed for throat irritation / pain.    [provider]  QUEtiapine (SEROQUEL) 100 MG tablet Take 100 mg by mouth at bedtime.    [provider]  Skin Protectants, Misc. (EUCERIN) cream Apply 1 application topically as needed for dry skin.     [provider]  sodium phosphate (FLEET) 7-19 GM/118ML ENEM Place 1 enema rectally as needed for moderate constipation or severe constipation.     [provider]  zolpidem (AMBIEN) 10 MG tablet Take 10 mg by mouth at bedtime.    [provider]    Allergies    Ativan [lorazepam], Depakote [divalproex sodium], and Vyvanse [lisdexamfetamine]  Review of Systems   Review of Systems Unable to review systems as patient is nonverbal Physical Exam Updated Vital Signs BP 123/70   Pulse 86   Temp 98.3 F (36.8 C) (Oral)   Resp 16   SpO2 99%   Physical Exam Vitals and nursing note reviewed.  Constitutional:      General: He is not in acute distress.    Appearance: He is well-developed. He is not diaphoretic.     Comments: Patient lying in the right lateral decubitus position and seizure  precautions.  He regards me as I enter the room but is nonverbal.  HENT:     Head: Normocephalic and atraumatic.  Eyes:     General: No scleral icterus.    Conjunctiva/sclera: Conjunctivae normal.  Cardiovascular:     Rate and Rhythm: Normal rate and regular rhythm.     Heart sounds: Normal heart sounds.  Pulmonary:     Effort: Pulmonary effort is normal. No respiratory distress.     Breath sounds: Normal breath sounds.  Abdominal:     Palpations: Abdomen is soft.     Tenderness: There is no abdominal tenderness.  Genitourinary:  Penis: Normal.   Musculoskeletal:     Cervical back: Normal range of motion and neck supple.  Skin:    General: Skin is warm and dry.  Neurological:     Mental Status: He is alert.  Psychiatric:        Behavior: Behavior normal.     ED Results / Procedures / Treatments   Labs (all labs ordered are listed, but only abnormal results are displayed) Labs Reviewed  CBC WITH DIFFERENTIAL/PLATELET - Abnormal; Notable for the following components:      Result Value   Abs Immature Granulocytes 0.09 (*)    All other components within normal limits  COMPREHENSIVE METABOLIC PANEL - Abnormal; Notable for the following components:   Sodium 132 (*)    Chloride 97 (*)    Glucose, Bld 110 (*)    AST 48 (*)    ALT 61 (*)    All other components within normal limits  MAGNESIUM  CBG MONITORING, ED    EKG None  Radiology No results found.  Procedures Procedures (including critical care time)  Medications Ordered in ED Medications - No data to display  ED Course  I have reviewed the triage vital signs and the nursing notes.  Pertinent labs & imaging results that were available during my care of the patient were reviewed by me and considered in my medical decision making (see chart for details).  Clinical Course as of Mar 25 1306  Sat Mar 25, 2019  1307 Calcium: 9.0 [AH]  1307 Comprehensive metabolic panel(!) [AH]    Clinical Course User  Index [AH] Arthor Captain, PA-C   MDM Rules/Calculators/A&P                        19 y/o non-verbal , autistic male with epilepsy. Recent neuro visit with medication changes that have not yet been implemented by group home staff. Labs show mildy decreased sodium of not low enough to cause of the patient's seizure today.  Patient does have mildly increased AST and ALT.  CBC shows no leukocytosis.  Normal magnesium level.  He has had no repeat seizure here in the emergency department.  Patient will be discharged back to his group home with recommendations to implement the medication changes suggested by his neurologist.  He appears appropriate for discharge at this time       Final Clinical Impression(s) / ED Diagnoses Final diagnoses:  Seizure [facility])    Rx / DC Orders ED Discharge Orders    None       Arthor Captain, PA-C 03/25/19 1308    Bethann Berkshire, MD 03/27/19 0740

## 2019-03-24 NOTE — ED Notes (Signed)
Nicholas Shepherd at [phone] and she said she is arranging transport to pick the patient up and take him back to the group home.

## 2019-03-24 NOTE — Consult Note (Signed)
[facility] ED Discharge  03/24/2019 9:59 AM Nicholas Shepherd  MRN:  [id number] Principal Problem: <principal problem not specified> Discharge Diagnoses: Active Problems:   * No active hospital problems. *   Subjective: Patient assessed by nurse practitioner, along with Dr. Lucianne Muss.  Patient appears at baseline.  Patient does not respond verbally however he does respond with hand gestures.  Patient responds with "thumbs up" gesture when asked about his mood today. Placed under involuntary commitment by current group home after becoming upset and acting out.  Total Time spent with patient: 30 minutes  Past Psychiatric History: Aggressive behavior, disruptive mood dysregulation disorder, autism spectrum disorder, intellectual disability  Past Medical History:  Past Medical History:  Diagnosis Date  . Anxiety   . Autism disorder    severe per legal gaurdian  . Depression   . Nonverbal   . Otitis media   . Seizures (HCC) 11/09/2017, 03/2018   most recent seizure activity on 04/28/18 x 4 minutes   History reviewed. No pertinent surgical history. Family History:  Family History  Problem Relation Age of Onset  . Seizures Mother        epilepsy as a child   Family Psychiatric  History: Unknown Social History:  Social History   Substance and Sexual Activity  Alcohol Use No     Social History   Substance and Sexual Activity  Drug Use No    Social History   Socioeconomic History  . Marital status: Single    Spouse name: Not on file  . Number of children: 0  . Years of education: 82  . Highest education level: Not on file  Occupational History    Comment: NA  Tobacco Use  . Smoking status: Never Smoker  . Smokeless tobacco: Never Used  Substance and Sexual Activity  . Alcohol use: No  . Drug use: No  . Sexual activity: Never  Other Topics Concern  . Not on file  Social History Narrative   Lives with mother, legal guardian   08/02/2018 living at [facility] group home   No  caffeine   Social Determinants of Health   Financial Resource Strain:   . Difficulty of Paying Living Expenses: Not on file  Food Insecurity:   . Worried About Programme researcher, broadcasting/film/video in the Last Year: Not on file  . Ran Out of Food in the Last Year: Not on file  Transportation Needs:   . Lack of Transportation (Medical): Not on file  . Lack of Transportation (Non-Medical): Not on file  Physical Activity:   . Days of Exercise per Week: Not on file  . Minutes of Exercise per Session: Not on file  Stress:   . Feeling of Stress : Not on file  Social Connections:   . Frequency of Communication with Friends and Family: Not on file  . Frequency of Social Gatherings with Friends and Family: Not on file  . Attends Religious Services: Not on file  . Active Member of Clubs or Organizations: Not on file  . Attends Banker Meetings: Not on file  . Marital Status: Not on file    Has this patient used any form of tobacco in the last 30 days? (Cigarettes, Smokeless Tobacco, Cigars, and/or Pipes) A prescription for an FDA-approved tobacco cessation medication was offered at discharge and the patient refused  Current Medications: Current Facility-Administered Medications  Medication Dose Route Frequency Provider Last Rate Last Admin  . acetaminophen (TYLENOL) tablet 650 mg  650 mg  Oral Q4H PRN Couture, Cortni S, PA-C      . benztropine (COGENTIN) tablet 1 mg  1 mg Oral Daily Couture, Cortni S, PA-C   1 mg at 03/24/19 0830  . bisacodyl (DULCOLAX) suppository 10 mg  10 mg Rectal PRN Couture, Cortni S, PA-C      . clomiPRAMINE (ANAFRANIL) capsule 100 mg  100 mg Oral BID Couture, Cortni S, PA-C   100 mg at 03/24/19 0945  . cloNIDine (CATAPRES) tablet 0.2 mg  0.2 mg Oral BID Couture, Cortni S, PA-C   0.2 mg at 03/24/19 0830  . haloperidol (HALDOL) tablet 10 mg  10 mg Oral BID Couture, Cortni S, PA-C   10 mg at 03/24/19 0831  . hydrOXYzine (ATARAX/VISTARIL) tablet 50 mg  50 mg Oral TID  Gerhard Munch, MD   50 mg at 03/24/19 0829  . ibuprofen (ADVIL) tablet 800 mg  800 mg Oral PRN Couture, Cortni S, PA-C      . lamoTRIgine (LAMICTAL) tablet 150 mg  150 mg Oral BID Couture, Cortni S, PA-C   150 mg at 03/24/19 0829  . Melatonin TABS 5 mg  5 mg Oral QHS Couture, Cortni S, PA-C   5 mg at 03/23/19 2112  . OXcarbazepine (TRILEPTAL) tablet 150 mg  150 mg Oral BID Couture, Cortni S, PA-C   150 mg at 03/24/19 0830  . QUEtiapine (SEROQUEL) tablet 100 mg  100 mg Oral QHS Couture, Cortni S, PA-C   100 mg at 03/23/19 2112  . zolpidem (AMBIEN) tablet 10 mg  10 mg Oral QHS Couture, Cortni S, PA-C   10 mg at 03/23/19 2111   Current Outpatient Medications  Medication Sig Dispense Refill  . acetaminophen (TYLENOL) 325 MG tablet Take 650 mg by mouth every 4 (four) hours as needed for mild pain, fever or headache.     . Aloe Vera GEL Apply 1 application topically 2 (two) times daily as needed (wound).    . benztropine (COGENTIN) 1 MG tablet Take 1 mg by mouth daily.    . bisacodyl (DULCOLAX) 10 MG suppository Place 10 mg rectally as needed for moderate constipation.     . carbamide peroxide (DEBROX) 6.5 % OTIC solution Place 4 drops into both ears as needed (wax buildup).     . clomiPRAMINE (ANAFRANIL) 50 MG capsule Take 100 mg by mouth 2 (two) times daily.    . cloNIDine (CATAPRES) 0.2 MG tablet Take 0.2 mg by mouth 2 (two) times daily.   1  . Ensure Plus (ENSURE PLUS) LIQD Take 237 mLs by mouth as needed (nutrition).     Marland Kitchen guaiFENesin (ROBITUSSIN) 100 MG/5ML liquid Take 200 mg by mouth every 6 (six) hours as needed.     . haloperidol (HALDOL) 10 MG tablet Take 10 mg by mouth 2 (two) times daily.    . hydrocortisone (ANUSOL-HC) 2.5 % rectal cream Place 1 application rectally as needed for hemorrhoids or anal itching (up to 5 times daily).     . hydrocortisone cream 1 % Apply 1 application topically 3 (three) times daily as needed for itching.     . hydrOXYzine (VISTARIL) 50 MG capsule Take 50  mg by mouth 3 (three) times daily.    Marland Kitchen ibuprofen (ADVIL) 400 MG tablet Take 800 mg by mouth as needed for moderate pain.     Marland Kitchen lamoTRIgine (LAMICTAL) 100 MG tablet Take 150 mg by mouth in the morning and at bedtime.    Marland Kitchen loratadine (CLARITIN) 10 MG tablet Take  10 mg by mouth daily as needed for allergies.    . magnesium hydroxide (MILK OF MAGNESIA) 400 MG/5ML suspension Take 30 mLs by mouth as needed for mild constipation.     . Melatonin 5 MG TABS Take 5 mg by mouth at bedtime.    . Multiple Vitamins-Minerals (MULTIVITAMIN ADULT PO) Take 1 tablet by mouth daily.    Marland Kitchen neomycin-bacitracin-polymyxin (NEOSPORIN) ointment Apply 1 application topically as needed for wound care.    . OXcarbazepine (TRILEPTAL) 150 MG tablet Takes 300mg  by mouth in AM and 150mg  by mouth in PM (Patient taking differently: Take 150 mg by mouth 2 (two) times daily. ) 120 tablet 5  . phenol (CHLORASEPTIC) 1.4 % LIQD Use as directed 3 sprays in the mouth or throat every 2 (two) hours as needed for throat irritation / pain.    Marland Kitchen QUEtiapine (SEROQUEL) 100 MG tablet Take 100 mg by mouth at bedtime.    . Skin Protectants, Misc. (EUCERIN) cream Apply 1 application topically as needed for dry skin.     . sodium phosphate (FLEET) 7-19 GM/118ML ENEM Place 1 enema rectally as needed for moderate constipation or severe constipation.     Marland Kitchen zolpidem (AMBIEN) 10 MG tablet Take 10 mg by mouth at bedtime.    . haloperidol (HALDOL) 10 MG tablet Take 1 tablet (10 mg total) by mouth at bedtime. (Patient not taking: Reported on 03/08/2019) 30 tablet 0  . lamoTRIgine (LAMICTAL) 150 MG tablet Take 1 tablet (150 mg total) by mouth 2 (two) times daily. (Patient not taking: Reported on 03/23/2019) 180 tablet 0   PTA Medications: (Not in a hospital admission)   Musculoskeletal: Strength & Muscle Tone: within normal limits Gait & Station: normal Patient leans: N/A  Psychiatric Specialty Exam: Physical Exam Vitals and nursing note reviewed.   Constitutional:      Appearance: He is well-developed.  HENT:     Head: Normocephalic.  Cardiovascular:     Rate and Rhythm: Normal rate.  Pulmonary:     Effort: Pulmonary effort is normal.  Neurological:     Mental Status: He is alert and oriented to person, place, and time.  Psychiatric:        Attention and Perception: He is inattentive.        Mood and Affect: Mood normal.        Speech: Speech is delayed.        Behavior: Behavior is cooperative.        Thought Content: Thought content normal.        Cognition and Memory: Cognition is impaired.        Judgment: Judgment is impulsive.     Review of Systems  Constitutional: Negative.   HENT: Negative.   Eyes: Negative.   Respiratory: Negative.   Cardiovascular: Negative.   Gastrointestinal: Negative.   Genitourinary: Negative.   Musculoskeletal: Negative.   Skin: Negative.   Neurological: Negative.   Psychiatric/Behavioral: Positive for decreased concentration.    Blood pressure 119/76, pulse 89, temperature 97.8 F (36.6 C), temperature source Oral, resp. rate 20, SpO2 98 %.There is no height or weight on file to calculate BMI.  General Appearance: Casual and Fairly Groomed  Eye Contact:  Minimal  Speech:  NA  Volume:  NA  Mood:  NA  Affect:  Congruent  Thought Process:  NA  Orientation:  Other:  Unable to assess  Thought Content:  NA  Suicidal Thoughts:  Unable to assess  Homicidal Thoughts:  Unable to assess  Memory:  Unable to assess  Judgement:  Impaired  Insight:  NA  Psychomotor Activity:  Normal  Concentration:  Concentration: Poor and Attention Span: Poor  Recall:  Unable to assess  Fund of Knowledge:  Unable to assess  Language:  NA  Akathisia:  No  Handed:  Right  AIMS (if indicated):     Assets:  Communication Skills Desire for Improvement Financial Resources/Insurance Housing Intimacy Leisure Time Physical Health Resilience Social Support  ADL's:  Intact  Cognition:  Impaired,  Mild   Sleep:        Demographic Factors:  Male  Loss Factors: NA  Historical Factors: Impulsivity  Risk Reduction Factors:   Living with another person, especially a relative, Positive social support, Positive therapeutic relationship and Positive coping skills or problem solving skills  Continued Clinical Symptoms:  More than one psychiatric diagnosis  Cognitive Features That Contribute To Risk:  None    Suicide Risk:  Minimal: No identifiable suicidal ideation.  Patients presenting with no risk factors but with morbid ruminations; may be classified as minimal risk based on the severity of the depressive symptoms    Plan Of Care/Follow-up recommendations:  Other:  Follow-up with established outpatient psychiatry provider  Disposition: Patient cleared by psychiatry Patrcia Dolly, FNP 03/24/2019, 9:59 AM

## 2019-03-24 NOTE — ED Provider Notes (Signed)
Emergency Medicine Observation Re-evaluation Note  Nicholas Shepherd is a 19 y.o. male, seen on rounds today.  Pt initially presented to the ED for complaints of IVC Currently, the patient is sleeping.  Physical Exam  BP 119/76 (BP Location: Left Arm)   Pulse 89   Temp 97.8 F (36.6 C) (Oral)   Resp 20   SpO2 98%  Physical Exam  ED Course / MDM  EKG:    I have reviewed the labs performed to date as well as medications administered while in observation.  Recent changes in the last 24 hours include:  none Plan  Current plan is for reevaluation this morning. Patient is under full IVC at this time.   Sabas Sous, MD 03/24/19 [phone]
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
## 2019-04-09 ENCOUNTER — Other Ambulatory Visit: Payer: Self-pay

## 2019-04-09 ENCOUNTER — Emergency Department (HOSPITAL_COMMUNITY): Payer: Medicare Other

## 2019-04-09 ENCOUNTER — Emergency Department (HOSPITAL_COMMUNITY)
Admission: EM | Admit: 2019-04-09 | Discharge: 2019-04-09 | Disposition: A | Discharge status: Home / Self Care | Payer: Medicare Other | Attending: Emergency Medicine | Admitting: Emergency Medicine

## 2019-04-09 DIAGNOSIS — S00511A Abrasion of lip, initial encounter: Secondary | ICD-10-CM | POA: Diagnosis not present

## 2019-04-09 DIAGNOSIS — W2209XA Striking against other stationary object, initial encounter: Secondary | ICD-10-CM | POA: Diagnosis not present

## 2019-04-09 DIAGNOSIS — Y9210 Unspecified residential institution as the place of occurrence of the external cause: Secondary | ICD-10-CM | POA: Insufficient documentation

## 2019-04-09 DIAGNOSIS — Y999 Unspecified external cause status: Secondary | ICD-10-CM | POA: Insufficient documentation

## 2019-04-09 DIAGNOSIS — Z79899 Other long term (current) drug therapy: Secondary | ICD-10-CM | POA: Diagnosis not present

## 2019-04-09 DIAGNOSIS — F840 Autistic disorder: Secondary | ICD-10-CM | POA: Diagnosis not present

## 2019-04-09 DIAGNOSIS — Y9389 Activity, other specified: Secondary | ICD-10-CM | POA: Diagnosis not present

## 2019-04-09 DIAGNOSIS — R569 Unspecified convulsions: Secondary | ICD-10-CM | POA: Insufficient documentation

## 2019-04-09 DIAGNOSIS — S0990XA Unspecified injury of head, initial encounter: Secondary | ICD-10-CM | POA: Diagnosis not present

## 2019-04-09 LAB — CBC WITH DIFFERENTIAL/PLATELET
Abs Immature Granulocytes: 0.19 10*3/uL — ABNORMAL HIGH (ref 0.00–0.07)
Basophils Absolute: 0.1 10*3/uL (ref 0.0–0.1)
Basophils Relative: 1 %
Eosinophils Absolute: 0 10*3/uL (ref 0.0–0.5)
Eosinophils Relative: 0 %
HCT: 46.5 % (ref 39.0–52.0)
Hemoglobin: 15.3 g/dL (ref 13.0–17.0)
Immature Granulocytes: 2 %
Lymphocytes Relative: 12 %
Lymphs Abs: 1.3 10*3/uL (ref 0.7–4.0)
MCH: 27.1 pg (ref 26.0–34.0)
MCHC: 32.9 g/dL (ref 30.0–36.0)
MCV: 82.3 fL (ref 80.0–100.0)
Monocytes Absolute: 0.8 10*3/uL (ref 0.1–1.0)
Monocytes Relative: 8 %
Neutro Abs: 8.3 10*3/uL — ABNORMAL HIGH (ref 1.7–7.7)
Neutrophils Relative %: 77 %
Platelets: 310 10*3/uL (ref 150–400)
RBC: 5.65 MIL/uL (ref 4.22–5.81)
RDW: 13 % (ref 11.5–15.5)
WBC: 10.7 10*3/uL — ABNORMAL HIGH (ref 4.0–10.5)
nRBC: 0 % (ref 0.0–0.2)

## 2019-04-09 LAB — COMPREHENSIVE METABOLIC PANEL
ALT: 93 U/L — ABNORMAL HIGH (ref 0–44)
AST: 46 U/L — ABNORMAL HIGH (ref 15–41)
Albumin: 4.5 g/dL (ref 3.5–5.0)
Alkaline Phosphatase: 128 U/L — ABNORMAL HIGH (ref 38–126)
Anion gap: 11 (ref 5–15)
BUN: 11 mg/dL (ref 6–20)
CO2: 25 mmol/L (ref 22–32)
Calcium: 9.1 mg/dL (ref 8.9–10.3)
Chloride: 95 mmol/L — ABNORMAL LOW (ref 98–111)
Creatinine, Ser: 0.81 mg/dL (ref 0.61–1.24)
GFR calc Af Amer: >60 mL/min (ref >60)
GFR calc non Af Amer: >60 mL/min (ref >60)
Glucose, Bld: 86 mg/dL (ref 70–99)
Potassium: 4 mmol/L (ref 3.5–5.1)
Sodium: 131 mmol/L — ABNORMAL LOW (ref 135–145)
Total Bilirubin: 0.6 mg/dL (ref 0.3–1.2)
Total Protein: 7.4 g/dL (ref 6.5–8.1)

## 2019-04-09 LAB — MAGNESIUM: Magnesium: 2.5 mg/dL — ABNORMAL HIGH (ref 1.7–2.4)

## 2019-04-09 NOTE — ED Triage Notes (Signed)
Per EMS: Pt from group home for witnessed seizure lasting reportedly 4 minutes. Patient at baseline and is non verbal

## 2019-04-09 NOTE — ED Notes (Signed)
Patient's mother on the way to pick up patient

## 2019-04-09 NOTE — ED Provider Notes (Signed)
[place] DEPT Provider Note   CSN: [id number] Arrival date & time: 04/09/19  1600  LEVEL 5 CAVEAT - NONVERBAL History Chief Complaint  Patient presents with  . Seizures    Nicholas Shepherd is a 19 y.o. male.  HPI 19 year old male presents with seizure.  Known seizure history.  I discussed with the nurse at his group home, Charolotte Eke, who states that the patient had a seizure lasting about 5 minutes.  Hit his head on the table.  Thus per protocol they sent him out and were concerned about a possible head injury.  He is nonverbal at baseline and seems to be acting near his baseline according to EMS from the group home.  Otherwise, his meds were recently upped and he has been taking them per the nurse.  No recent illness such as fever.  Past Medical History:  Diagnosis Date  . Anxiety   . Autism disorder    severe per legal gaurdian  . Depression   . Nonverbal   . Otitis media   . Seizures ([place]) 11/09/2017, 03/2018   most recent seizure activity on 04/28/18 x 4 minutes    Patient Active Problem List   Diagnosis Date Noted  . Seizure disorder ([place]) 02/03/2019  . DMDD (disruptive mood dysregulation disorder) ([place]) 11/06/2017  . Autism spectrum disorder 11/06/2017  . Intellectual disability 11/06/2017  . Aggressive behavior 06/09/2017    No past surgical history on file.     Family History  Problem Relation Age of Onset  . Seizures Mother        epilepsy as a child    Social History   Tobacco Use  . Smoking status: Never Smoker  . Smokeless tobacco: Never Used  Substance Use Topics  . Alcohol use: No  . Drug use: No    Home Medications Prior to Admission medications   Medication Sig Start Date End Date Taking? Authorizing Provider  benztropine (COGENTIN) 1 MG tablet Take 1 mg by mouth daily.   Yes [provider]  clomiPRAMINE (ANAFRANIL) 50 MG capsule Take 100 mg by mouth 2 (two) times daily.   Yes [provider]  cloNIDine (CATAPRES) 0.2 MG tablet Take 0.2 mg by mouth 2 (two) times daily.  05/20/17  Yes [provider]  haloperidol (HALDOL) 10 MG tablet Take 10 mg by mouth 2 (two) times daily.   Yes [provider]  hydrOXYzine (VISTARIL) 50 MG capsule Take 50 mg by mouth 3 (three) times daily. 01/13/19  Yes [provider]  lamoTRIgine (LAMICTAL) 100 MG tablet Take 150 mg by mouth in the morning and at bedtime. 03/22/19  Yes [provider]  Melatonin 5 MG TABS Take 5 mg by mouth at bedtime.   Yes [provider]  Multiple Vitamins-Minerals (MULTIVITAMIN ADULT PO) Take 1 tablet by mouth daily.   Yes [provider]  OXcarbazepine (TRILEPTAL) 150 MG tablet Takes 300mg  by mouth in AM and 150mg  by mouth in PM Patient taking differently: Take 150 mg by mouth 2 (two) times daily.  03/23/19  Yes Lomax, Amy, NP  QUEtiapine (SEROQUEL) 100 MG tablet Take 100 mg by mouth at bedtime.   Yes [provider]  zolpidem (AMBIEN) 10 MG tablet Take 10 mg by mouth at bedtime.   Yes [provider]  acetaminophen (TYLENOL) 325 MG tablet Take 650 mg by mouth every 4 (four) hours as needed for mild pain, fever or headache.     [provider]  Aloe Vera GEL Apply 1 application topically 2 (two) times daily as needed (wound).    [provider]  hydrocortisone (ANUSOL-HC) 2.5 % rectal cream Place 1 application rectally as needed for hemorrhoids or anal itching (up to 5 times daily).     [provider]  hydrocortisone cream 1 % Apply 1 application topically 3 (three) times daily as needed for itching.     [provider]  ibuprofen (ADVIL) 400 MG tablet Take 800 mg by mouth as needed for moderate pain.     [provider]  loratadine (CLARITIN) 10 MG tablet Take 10 mg by mouth daily as needed for allergies.    [provider]  magnesium hydroxide (MILK OF MAGNESIA) 400 MG/5ML suspension Take 30  mLs by mouth as needed for mild constipation.     [provider]  neomycin-bacitracin-polymyxin (NEOSPORIN) ointment Apply 1 application topically as needed for wound care.    [provider]  phenol (CHLORASEPTIC) 1.4 % LIQD Use as directed 3 sprays in the mouth or throat every 2 (two) hours as needed for throat irritation / pain.    [provider]  Skin Protectants, Misc. (EUCERIN) cream Apply 1 application topically as needed for dry skin.     [provider]  sodium phosphate (FLEET) 7-19 GM/118ML ENEM Place 1 enema rectally as needed for moderate constipation or severe constipation.     [provider]    Allergies    Ativan [lorazepam], Depakote [divalproex sodium], and Vyvanse [lisdexamfetamine]  Review of Systems   Review of Systems  Unable to perform ROS: Patient nonverbal    Physical Exam Updated Vital Signs BP 121/60 (BP Location: Left Arm)   Pulse (!) 108   Temp 97.7 F (36.5 C) (Oral)   Resp 19   SpO2 95%   Physical Exam Vitals and nursing note reviewed.  Constitutional:      Appearance: He is well-developed.     Interventions: Cervical collar in place.     Comments: Non verbal. Awakens to voice but does not follow commands  HENT:     Head: Normocephalic.     Right Ear: External ear normal.     Left Ear: External ear normal.     Nose: Nose normal.     Mouth/Throat:     Mouth: Injury present.      Comments: No obvious tongue injury Eyes:     General:        Right eye: No discharge.        Left eye: No discharge.  Cardiovascular:     Rate and Rhythm: Normal rate and regular rhythm.     Heart sounds: Normal heart sounds.  Pulmonary:     Effort: Pulmonary effort is normal.     Breath sounds: Normal breath sounds.  Abdominal:     Palpations: Abdomen is soft.     Tenderness: There is no abdominal tenderness.  Musculoskeletal:     Cervical back: Neck supple.  Skin:    General: Skin is warm and dry.    Neurological:     Mental Status: He is alert.  Psychiatric:        Mood and Affect: Mood is not anxious.     ED Results / Procedures / Treatments   Labs (all labs ordered are listed, but only abnormal results are displayed) Labs Reviewed  COMPREHENSIVE METABOLIC PANEL - Abnormal; Notable for the following components:      Result Value  Sodium 131 (*)    Chloride 95 (*)    AST 46 (*)    ALT 93 (*)    Alkaline Phosphatase 128 (*)    All other components within normal limits  CBC WITH DIFFERENTIAL/PLATELET - Abnormal; Notable for the following components:   WBC 10.7 (*)    Neutro Abs 8.3 (*)    Abs Immature Granulocytes 0.19 (*)    All other components within normal limits  MAGNESIUM - Abnormal; Notable for the following components:   Magnesium 2.5 (*)    All other components within normal limits  LAMOTRIGINE LEVEL  10-HYDROXYCARBAZEPINE    EKG EKG Interpretation  Date/Time:  Sunday April 09 2019 16:11:46 EDT Ventricular Rate:  113 PR Interval:    QRS Duration: 90 QT Interval:  319 QTC Calculation: 438 R Axis:   90 Text Interpretation: Sinus tachycardia Borderline right axis deviation LVH by voltage Baseline wander in lead(s) III aVF similar to Jan 2021 Confirmed by Pricilla Loveless [phone]) on 04/09/2019 4:33:04 PM   Radiology CT HEAD WO CONTRAST  Result Date: 04/09/2019 CLINICAL DATA:  Head trauma, headache. EXAM: CT HEAD WITHOUT CONTRAST CT CERVICAL SPINE WITHOUT CONTRAST TECHNIQUE: Multidetector CT imaging of the head and cervical spine was performed following the standard protocol without intravenous contrast. Multiplanar CT image reconstructions of the cervical spine were also generated. COMPARISON:  None. FINDINGS: CT HEAD FINDINGS Brain: No evidence of acute infarction, hemorrhage, hydrocephalus, extra-axial collection or mass lesion/mass effect. Vascular: No hyperdense vessel or unexpected calcification. Skull: Normal. Negative for fracture or focal lesion.  Sinuses/Orbits: No acute finding. Other: None. CT CERVICAL SPINE FINDINGS Alignment: Normal. Skull base and vertebrae: No acute fracture. No primary bone lesion or focal pathologic process. Soft tissues and spinal canal: No prevertebral fluid or swelling. No visible canal hematoma. Disc levels:  No significant degenerative change Upper chest: Negative. Other: None. IMPRESSION: 1. No CT evidence of acute intracranial process. 2. No fracture or malalignment of the cervical spine. Electronically Signed   By: Emmaline Kluver M.D.   On: 04/09/2019 16:49   CT CERVICAL SPINE WO CONTRAST  Result Date: 04/09/2019 CLINICAL DATA:  Head trauma, headache. EXAM: CT HEAD WITHOUT CONTRAST CT CERVICAL SPINE WITHOUT CONTRAST TECHNIQUE: Multidetector CT imaging of the head and cervical spine was performed following the standard protocol without intravenous contrast. Multiplanar CT image reconstructions of the cervical spine were also generated. COMPARISON:  None. FINDINGS: CT HEAD FINDINGS Brain: No evidence of acute infarction, hemorrhage, hydrocephalus, extra-axial collection or mass lesion/mass effect. Vascular: No hyperdense vessel or unexpected calcification. Skull: Normal. Negative for fracture or focal lesion. Sinuses/Orbits: No acute finding. Other: None. CT CERVICAL SPINE FINDINGS Alignment: Normal. Skull base and vertebrae: No acute fracture. No primary bone lesion or focal pathologic process. Soft tissues and spinal canal: No prevertebral fluid or swelling. No visible canal hematoma. Disc levels:  No significant degenerative change Upper chest: Negative. Other: None. IMPRESSION: 1. No CT evidence of acute intracranial process. 2. No fracture or malalignment of the cervical spine. Electronically Signed   By: Emmaline Kluver M.D.   On: 04/09/2019 16:49    Procedures Procedures (including critical care time)  Medications Ordered in ED Medications - No data to display  ED Course  I have reviewed the triage  vital signs and the nursing notes.  Pertinent labs & imaging results that were available during my care of the patient were reviewed by me and considered in my medical decision making (see chart for details).  MDM Rules/Calculators/A&P                      Labs are reassuring.  Mild LFT elevation similar to before.  Mild hyponatremia also similar.  Elevated WBC is probably from the seizure itself and reactive.  He is afebrile.  He was a little postictal on arrival but now is pretty awake and alert though he does not respond or talk to me.  No obvious focal deficits.  Given the facial trauma, CT head and C-spine obtained as it is hard to rule out neck pain.  He does have a lip abrasion but no wound that would be amenable to repair.  No facial swelling so I think jaw fracture or other facial fracture is unlikely.  Call his neurologist tomorrow.  I have sent levels for his meds but these would not come back in a typical ED visit. Final Clinical Impression(s) / ED Diagnoses Final diagnoses:  Seizure (HCC)  Abrasion of lip, initial encounter    Rx / DC Orders ED Discharge Orders    None       Pricilla Loveless, MD 04/09/19 [phone]

## 2019-04-10 ENCOUNTER — Telehealth: Payer: Self-pay | Admitting: Family Medicine

## 2019-04-10 NOTE — Telephone Encounter (Signed)
Sharol Given, nurse at the group home where the pt resides called stating that the pt had a seizure that lasted 5 min and she is wanting to discuss with RN medication changes and possible test orders. Please advise.

## 2019-04-10 NOTE — Telephone Encounter (Signed)
I called and spoke to [name], went to ED 04-09-19, Questioning why having sz? Get EEG or MRI,.  Lmaotrigine 150mg  po bid, and oxcarbamazepine 300mg  po BID.  Please advsie.

## 2019-04-11 ENCOUNTER — Telehealth: Payer: Self-pay | Admitting: Family Medicine

## 2019-04-11 LAB — LAMOTRIGINE LEVEL: Lamotrigine Lvl: 4.9 ug/mL (ref 2.0–20.0)

## 2019-04-11 MED ORDER — LAMOTRIGINE 100 MG PO TABS: 200.0000 mg | ORAL_TABLET | Freq: Two times a day (BID) | ORAL | 11 refills | Status: AC

## 2019-04-11 MED ORDER — OXCARBAZEPINE 150 MG PO TABS: ORAL_TABLET | ORAL | 11 refills | Status: DC

## 2019-04-11 MED ORDER — LAMOTRIGINE 100 MG PO TABS: 200.0000 mg | ORAL_TABLET | Freq: Two times a day (BID) | ORAL | 11 refills | Status: DC

## 2019-04-11 NOTE — Telephone Encounter (Signed)
Please let the group home know that we will continue oxcarbamazepine 300mg  twice daily. Increase lamotrigine to 200mg  twice daily.I have placed orders to [facility]. I have discussed with Dr . We may consider video EEG/referral to epilepsy clinic in future if not responsive to dose increases. Please make sure he has scheduled follow up in 3 months. TY!

## 2019-04-11 NOTE — Telephone Encounter (Signed)
Hey Dr Marjory Lies. Can you take a look at this case with me. He continues to have seizures despite increasing lamotrigine and oxcarbamazepine. Now at lamotrigine 150mg  BID and oxcarbamazepine 300mg  BID. Case complicated by Autism with behavioral concerns and on multiple psych medications. He is intolerant to Depakote (aggressive behavior). I am thinking to add levetiracetam 500mg  BID but wanted to run this by you. Any other thoughts? Any need to repeat EEG? Last MRI in 2002 but CT head stable in 03/2019.

## 2019-04-11 NOTE — Addendum Note (Signed)
Addended by: Guy Begin on: 04/11/2019 10:17 AM   Modules accepted: Orders

## 2019-04-11 NOTE — Telephone Encounter (Signed)
-   continue oxcarbazepine 300mg  twice a day  - increase lamotrigine to 200mg  twice a day.  - I would avoid levetiracetam as it can aggravate his mood disorder.  - may consider adding on onfi 5mg  twice a day  in future - may consider video EEG / epilepsy clinic referral in future  , MD 04/11/2019, 9:20 AM Certified in Neurology, Neurophysiology and Neuroimaging  [facility] [address] [place], [place] [address] [phone]

## 2019-04-11 NOTE — Addendum Note (Signed)
Addended by: Shawnie Dapper L on: 04/11/2019 09:27 AM   Modules accepted: Orders

## 2019-04-11 NOTE — Telephone Encounter (Signed)
I called and spoke to [name] RN at the group home relayed the plan of care for pt.  Increase the lamotrigine 200mg  po bid, [facility] and [facility] to [facility] pharmacy as listed.  Keep oxcarbamazepine dose same.  Consider future VEEG/epilepsy clinic is continued sz.  She verbalized understanding.  appt left 04-25-19 DOXY per 11-27-1991, RN.

## 2019-04-12 NOTE — Progress Notes (Signed)
I reviewed note and agree with plan.   Kunta Hilleary R. Ronny Korff, MD 04/12/2019, 8:36 AM Certified in Neurology, Neurophysiology and Neuroimaging  [facility] [address], [address] [place], [place] [address] [phone]  

## 2019-04-13 LAB — 10-HYDROXYCARBAZEPINE: Triliptal/MTB(Oxcarbazepin): 5 ug/mL — ABNORMAL LOW (ref 10–35)

## 2019-04-17 ENCOUNTER — Telehealth: Payer: Self-pay | Admitting: *Deleted

## 2019-04-17 NOTE — Telephone Encounter (Signed)
I called [name] at [phone].  Pt has been doing ok, no seizures.  Relayed the lab test.  Has doxy visit 04-25-19 and will get repeat labs in 6 wks.  [name] verbalized understanding.

## 2019-04-17 NOTE — Telephone Encounter (Signed)
-----   Message from Shawnie Dapper, NP sent at 04/13/2019  4:41 PM EDT ----- carbamazepine was increased to 300mg  BID less than 2 weeks ago. Will continue current dose and increase lamotrigine as previously relayed to group home. Need to recheck labs in 6 weeks.

## 2019-04-24 NOTE — Progress Notes (Signed)
PATIENT: Nicholas Shepherd DOB: October 11, 2000  REASON FOR VISIT: follow up HISTORY FROM: patient  Virtual Visit via Telephone Note  I connected with Nicholas Shepherd on 04/25/19 at  8:30 AM EDT by telephone and verified that I am speaking with the correct person using two identifiers.   I discussed the limitations, risks, security and privacy concerns of performing an evaluation and management service by telephone and the availability of in person appointments. I also discussed with the patient that there may be a patient responsible charge related to this service. The patient expressed understanding and agreed to proceed.   History of Present Illness:  04/25/19 Nicholas Shepherd is a 19 y.o. male here today for follow up for seizures. He is now taking lamotrigine 200mg  twice daily and carbamazepine 300mg  twice daily. Nicholas Shepherd, group home nurse, assists with providing history today. She reports that Nicholas Shepherd is doing fairly well. He is tolerating medications with no obvious adverse effects. He had a starring spell on 3/29 that lasted about 1 minute. She reports that he was semi responsive during event, no LOC, no tonic clonic movements. He is followed closely by psychiatry as well and is currently adjusting Haldol dose from 10mg  tablets to longer acting injection and 5mg  oral dosing. He is planning to move to a different group home today. Nicholas Shepherd does mention a history of low blood sugar readings with previous seizures.    History (copied from my note on 03/23/2019)  Nicholas Shepherd is a 19 y.o. male here today for follow up for seizure. We  increased lamotrigine to 150 mg twice daily mid-February following 2 prolonged seizures requiring EMS transport to the hospital.  Nicholas Shepherd, group home caregiver, aids in history today.  She reports that he has had 2 smaller seizures since increasing lamotrigine.  Last ER visit revealed suboptimal ox carbamazepine levels.  Orders were sent to increase oxcarbamazepine to 300 mg  in the morning and 150 mg in the afternoon for 2 weeks then increase to 300 mg twice daily.  Nicholas Shepherd states that they have just received that medication today.  They will initiate increased dose today.  Otherwise he is doing well.  He continues to follow psychiatry closely.  See reports that he anticipates changing to a different group home next week.  She will relay all information including dose changes in medications with transfer.    Observations/Objective:  Not performed due to nature of visit    Assessment and Plan:  19 y.o. year old male  has a past medical history of Anxiety, Autism disorder, Depression, Nonverbal, Otitis media, and Seizures ([place]) (11/09/2017, 03/2018). here with    ICD-10-CM   1. Seizure disorder ([place])  G40.909 CMP    10-Hydroxycarbazepine    Lamotrigine level  2. Autism  F84.0     [name] is doing well today. He is tolerating lamotrigine 200mg  BID and oxcarbamazepine 300mg  BID. Last sodium 131 on 3/21. Glucose has been unremarkable. He has follow up tomorrow with PCP. I have requested repeat CMP, oxcarbamazepine and lamotrigine levels to be checked at his appt. We will continue current treatment plan for now. He has scheduled follow up with me on 4/27. We will continue to monitor for seizure activity and reassess treatment plan pending lab results. Will consider adding Onfi 5mg  mg BID if seizure activity continues. May also consider video EEG and/or epilepsy clinic referral if needed.   Orders Placed This Encounter  Procedures  . CMP    Standing Status:  Future    Standing Expiration Date:   04/24/2020  . 10-Hydroxycarbazepine    Standing Status:   Future    Standing Expiration Date:   04/24/2020  . Lamotrigine level    Standing Status:   Future    Standing Expiration Date:   04/24/2020    No orders of the defined types were placed in this encounter.    Follow Up Instructions:  I discussed the assessment and treatment plan with the patient. The patient was  provided an opportunity to ask questions and all were answered. The patient agreed with the plan and demonstrated an understanding of the instructions.   The patient was advised to call back or seek an in-person evaluation if the symptoms worsen or if the condition fails to improve as anticipated.  I provided 15 minutes of non-face-to-face time during this encounter. Patient and Maralyn Sago, group home nurse, are located at group home during doxy.me visit. Provider is in the office.    Shawnie Dapper, NP

## 2019-04-25 ENCOUNTER — Telehealth: Payer: Self-pay | Admitting: *Deleted

## 2019-04-25 ENCOUNTER — Other Ambulatory Visit: Payer: Self-pay

## 2019-04-25 ENCOUNTER — Telehealth (INDEPENDENT_AMBULATORY_CARE_PROVIDER_SITE_OTHER): Payer: Medicare Other | Admitting: Family Medicine

## 2019-04-25 DIAGNOSIS — G40909 Epilepsy, unspecified, not intractable, without status epilepticus: Secondary | ICD-10-CM | POA: Diagnosis not present

## 2019-04-25 DIAGNOSIS — F840 Autistic disorder: Secondary | ICD-10-CM

## 2019-04-25 NOTE — Telephone Encounter (Signed)
Last ofv note faxed to Sarah at group home on pt per AL/NP request [phone] received fax confirmation.

## 2019-04-27 ENCOUNTER — Ambulatory Visit: Payer: Medicare Other | Attending: Family

## 2019-04-27 DIAGNOSIS — Z23 Encounter for immunization: Secondary | ICD-10-CM

## 2019-04-27 NOTE — Progress Notes (Signed)
   Covid-19 Vaccination Clinic  Name:  Nicholas Shepherd    MRN: [id number] DOB: [birth year]  04/27/2019  Mr. Baize was observed post Covid-19 immunization for 15 minutes without incident. He was provided with Vaccine Information Sheet and instruction to access the V-Safe system.   Mr. Blades was instructed to call 911 with any severe reactions post vaccine: Marland Kitchen Difficulty breathing  . Swelling of face and throat  . A fast heartbeat  . A bad rash all over body  . Dizziness and weakness   Immunizations Administered    Name Date Dose VIS Date Route   Moderna COVID-19 Vaccine 04/27/2019 11:22 AM 0.5 mL 12/20/2018 Intramuscular   Manufacturer: Moderna   Lot: [id number]   NDC: 17530-104-04

## 2019-05-01 ENCOUNTER — Telehealth: Payer: Self-pay | Admitting: *Deleted

## 2019-05-01 NOTE — Telephone Encounter (Signed)
I spoke to mother who gave me # to new group home.  Pt is more aggressive and that is reason for change. Theresia Lo, [employer])  [phone].  [facility] # [phone].  Pt went for labs this am.

## 2019-05-09 ENCOUNTER — Telehealth: Payer: Self-pay | Admitting: *Deleted

## 2019-05-09 NOTE — Telephone Encounter (Signed)
Labs requested is in careeverywhere 05-02-19

## 2019-05-09 NOTE — Telephone Encounter (Signed)
Can you check with group home and verify dose of oxcarbamazepine please? His levels are still low following increased dose in March. Previously 5 and now 4 despite doubling dose. Is he taking medication consistently? Any seizure activity?

## 2019-05-09 NOTE — Telephone Encounter (Signed)
I called Traci, at [facility] group home.  Pt has started school now.  She states that he has been taking lamotrigine 200mg  po bid, and oxcarbamazepine 300mg  po bid.  Thye received pt she states 04-25-2019, lab drawn 05-02-19, No seizures.  She spoke of them possible doing a gene type test (tells of metabolism of medications).  If you wanted to do another lab they could.

## 2019-05-11 NOTE — Progress Notes (Signed)
I reviewed note and agree with plan.   Oliviana Mcgahee R. Calan Doren, MD 05/11/2019, 2:23 PM Certified in Neurology, Neurophysiology and Neuroimaging  [facility] [address], [address] [place], [place] [address] [phone]  

## 2019-05-11 NOTE — Telephone Encounter (Signed)
They will call if any seizure activity.

## 2019-05-11 NOTE — Progress Notes (Signed)
I reviewed note and agree with plan.   Broc Caspers R. Jourdyn Ferrin, MD 05/11/2019, 2:22 PM Certified in Neurology, Neurophysiology and Neuroimaging  [facility] [address], [address] [place], [place] [address] [phone]  

## 2019-05-11 NOTE — Telephone Encounter (Signed)
I am reassured he is not having seizures so we will continue current treatment plan. Levels could be related to timing of labs. Please have them follow up closely for any breakthrough seizures.

## 2019-05-14 ENCOUNTER — Other Ambulatory Visit: Payer: Self-pay

## 2019-05-14 ENCOUNTER — Emergency Department (HOSPITAL_COMMUNITY)
Admission: EM | Admit: 2019-05-14 | Discharge: 2019-05-14 | Disposition: A | Discharge status: Home / Self Care | Payer: Medicare Other | Attending: Emergency Medicine | Admitting: Emergency Medicine

## 2019-05-14 ENCOUNTER — Encounter (HOSPITAL_COMMUNITY): Payer: Self-pay

## 2019-05-14 DIAGNOSIS — F840 Autistic disorder: Secondary | ICD-10-CM | POA: Diagnosis not present

## 2019-05-14 DIAGNOSIS — Z79899 Other long term (current) drug therapy: Secondary | ICD-10-CM | POA: Diagnosis not present

## 2019-05-14 DIAGNOSIS — R569 Unspecified convulsions: Secondary | ICD-10-CM | POA: Diagnosis not present

## 2019-05-14 LAB — COMPREHENSIVE METABOLIC PANEL
ALT: 32 U/L (ref 0–44)
AST: 31 U/L (ref 15–41)
Albumin: 4.4 g/dL (ref 3.5–5.0)
Alkaline Phosphatase: 103 U/L (ref 38–126)
Anion gap: 11 (ref 5–15)
BUN: 11 mg/dL (ref 6–20)
CO2: 24 mmol/L (ref 22–32)
Calcium: 9.2 mg/dL (ref 8.9–10.3)
Chloride: 101 mmol/L (ref 98–111)
Creatinine, Ser: 0.93 mg/dL (ref 0.61–1.24)
GFR calc Af Amer: >60 mL/min (ref >60)
GFR calc non Af Amer: >60 mL/min (ref >60)
Glucose, Bld: 94 mg/dL (ref 70–99)
Potassium: 4.1 mmol/L (ref 3.5–5.1)
Sodium: 136 mmol/L (ref 135–145)
Total Bilirubin: 0.6 mg/dL (ref 0.3–1.2)
Total Protein: 7.4 g/dL (ref 6.5–8.1)

## 2019-05-14 LAB — BLOOD GAS, VENOUS
Acid-Base Excess: 1.2 mmol/L (ref 0.0–2.0)
Bicarbonate: 27.5 mmol/L (ref 20.0–28.0)
O2 Saturation: 70.2 %
Patient temperature: 98.6
pCO2, Ven: 51.8 mmHg (ref 44.0–60.0)
pH, Ven: 7.344 (ref 7.250–7.430)
pO2, Ven: 45.1 mmHg — ABNORMAL HIGH (ref 32.0–45.0)

## 2019-05-14 LAB — CBC WITH DIFFERENTIAL/PLATELET
Abs Immature Granulocytes: 0.13 10*3/uL — ABNORMAL HIGH (ref 0.00–0.07)
Basophils Absolute: 0 10*3/uL (ref 0.0–0.1)
Basophils Relative: 0 %
Eosinophils Absolute: 0 10*3/uL (ref 0.0–0.5)
Eosinophils Relative: 0 %
HCT: 47.2 % (ref 39.0–52.0)
Hemoglobin: 15.6 g/dL (ref 13.0–17.0)
Immature Granulocytes: 1 %
Lymphocytes Relative: 9 %
Lymphs Abs: 1.3 10*3/uL (ref 0.7–4.0)
MCH: 27.6 pg (ref 26.0–34.0)
MCHC: 33.1 g/dL (ref 30.0–36.0)
MCV: 83.4 fL (ref 80.0–100.0)
Monocytes Absolute: 1.2 10*3/uL — ABNORMAL HIGH (ref 0.1–1.0)
Monocytes Relative: 8 %
Neutro Abs: 12 10*3/uL — ABNORMAL HIGH (ref 1.7–7.7)
Neutrophils Relative %: 82 %
Platelets: 290 10*3/uL (ref 150–400)
RBC: 5.66 MIL/uL (ref 4.22–5.81)
RDW: 13.1 % (ref 11.5–15.5)
WBC: 14.6 10*3/uL — ABNORMAL HIGH (ref 4.0–10.5)
nRBC: 0 % (ref 0.0–0.2)

## 2019-05-14 LAB — URINALYSIS, ROUTINE W REFLEX MICROSCOPIC
Bacteria, UA: NONE SEEN
Bilirubin Urine: NEGATIVE
Glucose, UA: NEGATIVE mg/dL
Hgb urine dipstick: NEGATIVE
Ketones, ur: NEGATIVE mg/dL
Leukocytes,Ua: NEGATIVE
Nitrite: NEGATIVE
Protein, ur: 100 mg/dL — AB
Specific Gravity, Urine: 1.028 (ref 1.005–1.030)
pH: 5 (ref 5.0–8.0)

## 2019-05-14 NOTE — ED Provider Notes (Signed)
[facility]-EMERGENCY DEPT Provider Note   CSN: [id number] Arrival date & time: 05/14/19  1955     History Chief Complaint  Patient presents with  . Seizures    Nicholas Shepherd is a 19 y.o. male.  HPI Patient presents for evaluation of a seizure.  I talked to his group home director who states that the patient was "out with staff", and a usual excursion, when he had a seizure.  He is reported to be shaking for several minutes followed by postictal state.  The patient has been at his current group home, for 3 weeks.  The director, Minerva Areola, states that he is aware that the patient had another seizure in January 2021.  The patient is reported to be nonverbal but cooperative, and able to walk easily.  Level 5 caveat-autism    Past Medical History:  Diagnosis Date  . Anxiety   . Autism disorder    severe per legal gaurdian  . Depression   . Nonverbal   . Otitis media   . Seizures (HCC) 11/09/2017, 03/2018   most recent seizure activity on 04/28/18 x 4 minutes    Patient Active Problem List   Diagnosis Date Noted  . Seizure disorder (HCC) 02/03/2019  . DMDD (disruptive mood dysregulation disorder) (HCC) 11/06/2017  . Autism spectrum disorder 11/06/2017  . Intellectual disability 11/06/2017  . Aggressive behavior 06/09/2017    History reviewed. No pertinent surgical history.     Family History  Problem Relation Age of Onset  . Seizures Mother        epilepsy as a child    Social History   Tobacco Use  . Smoking status: Never Smoker  . Smokeless tobacco: Never Used  Substance Use Topics  . Alcohol use: No  . Drug use: No    Home Medications Prior to Admission medications   Medication Sig Start Date End Date Taking? Authorizing Provider  benztropine (COGENTIN) 1 MG tablet Take 1 mg by mouth daily.   Yes [provider]  clomiPRAMINE (ANAFRANIL) 50 MG capsule Take 50 mg by mouth 2 (two) times daily.    Yes [provider]    cloNIDine (CATAPRES) 0.2 MG tablet Take 0.2 mg by mouth 2 (two) times daily.  05/20/17  Yes [provider]  haloperidol (HALDOL) 5 MG tablet Take 5 mg by mouth 2 (two) times daily.   Yes [provider]  hydrOXYzine (ATARAX/VISTARIL) 50 MG tablet Take 50 mg by mouth 3 (three) times daily. 05/05/19  Yes [provider]  lamoTRIgine (LAMICTAL) 100 MG tablet Take 2 tablets (200 mg total) by mouth in the morning and at bedtime. Patient taking differently: Take 100 mg by mouth in the morning and at bedtime.  04/11/19  Yes Lomax, Amy, NP  Melatonin 5 MG TABS Take 5 mg by mouth at bedtime.   Yes [provider]  OXcarbazepine (TRILEPTAL) 150 MG tablet Take 300mg  (2 tablets) twice daily Patient taking differently: Take 150 mg by mouth daily.  04/11/19  Yes Lomax, Amy, NP  QUEtiapine (SEROQUEL) 100 MG tablet Take 100 mg by mouth at bedtime.   Yes [provider]  Valbenazine Tosylate (INGREZZA) 80 MG CAPS Take 80 mg by mouth daily.   Yes [provider]  zolpidem (AMBIEN) 10 MG tablet Take 10 mg by mouth at bedtime.   Yes [provider]    Allergies    Ativan [lorazepam], Depakote [divalproex sodium], and Vyvanse [lisdexamfetamine]  Review of Systems  Review of Systems  Unable to perform ROS: Mental status change    Physical Exam Updated Vital Signs BP 104/73 (BP Location: Left Arm)   Pulse (!) 107   Resp 16   SpO2 97%   Physical Exam Vitals and nursing note reviewed.  Constitutional:      General: He is not in acute distress.    Appearance: He is well-developed. He is not ill-appearing.  HENT:     Head: Normocephalic and atraumatic.     Right Ear: External ear normal.     Left Ear: External ear normal.     Nose: Nose normal.     Mouth/Throat:     Mouth: Mucous membranes are moist.     Comments: No bleeding from mouth Eyes:     Conjunctiva/sclera: Conjunctivae normal.     Pupils: Pupils are equal, round, and reactive to  light.  Neck:     Trachea: Phonation normal.  Cardiovascular:     Rate and Rhythm: Normal rate.  Pulmonary:     Effort: Pulmonary effort is normal.  Abdominal:     General: There is no distension.     Palpations: Abdomen is soft.     Tenderness: There is no abdominal tenderness.  Genitourinary:    Comments: Wearing a diaper Musculoskeletal:        General: Normal range of motion.     Cervical back: Normal range of motion and neck supple.  Skin:    General: Skin is warm and dry.  Neurological:     Mental Status: He is alert.     Cranial Nerves: No cranial nerve deficit.     Motor: No abnormal muscle tone.     Coordination: Coordination normal.  Psychiatric:        Mood and Affect: Mood normal.        Behavior: Behavior normal.     ED Results / Procedures / Treatments   Labs (all labs ordered are listed, but only abnormal results are displayed) Labs Reviewed  CBC WITH DIFFERENTIAL/PLATELET - Abnormal; Notable for the following components:      Result Value   WBC 14.6 (*)    Neutro Abs 12.0 (*)    Monocytes Absolute 1.2 (*)    Abs Immature Granulocytes 0.13 (*)    All other components within normal limits  BLOOD GAS, VENOUS - Abnormal; Notable for the following components:   pO2, Ven 45.1 (*)    All other components within normal limits  URINALYSIS, ROUTINE W REFLEX MICROSCOPIC - Abnormal; Notable for the following components:   Protein, ur 100 (*)    All other components within normal limits  COMPREHENSIVE METABOLIC PANEL    EKG EKG Interpretation  Date/Time:  Sunday May 14 2019 21:08:49 EDT Ventricular Rate:  106 PR Interval:    QRS Duration: 88 QT Interval:  336 QTC Calculation: 447 R Axis:   77 Text Interpretation: Sinus tachycardia since last tracing no significant change Confirmed by Mancel Bale [phone]) on 05/14/2019 9:37:07 PM   Radiology No results found.  Procedures Procedures (including critical care time)  Medications Ordered in  ED Medications - No data to display  ED Course  I have reviewed the triage vital signs and the nursing notes.  Pertinent labs & imaging results that were available during my care of the patient were reviewed by me and considered in my medical decision making (see chart for details).  Clinical Course as of May 15 1504  Sun May 14, 2019  2212 ,  Venous blood gas  Bilirubin Urine: NEGATIVE [EW]    Clinical Course User Index [EW] Daleen Bo, MD   MDM Rules/Calculators/A&P                       No data found.  10:02 PM-case discussed with his group home director.  He will come get the patient when stabilized.  Medical Decision Making:  This patient is presenting for evaluation of seizure, which does require a range of treatment options, and is a complaint that involves a moderate risk of morbidity and mortality. The differential diagnoses include seizure, syncope, metabolic instability. I decided  to review old records, and in summary patient with known seizure disorder, recently seen and managed by his neurologist, with lamotrigine, 200 mg twice daily and oxcarbazepine 300 mg twice daily. I got additional historical information from Randall Hiss, group home [employer]. Clinical Laboratory Tests Ordered, included CBC, metabolic panel, urinalysis, venous blood gas.  Normal except elevated white count   Critical Interventions-clinical evaluation, laboratory testing, observation.  No seizures during observation in the ED.  Ambulation trial to determine ability for discharge  After These Interventions, the Patient was reevaluated and was found stable and comfortable  CRITICAL CARE-no Performed by: Daleen Bo   Nursing Notes Reviewed/ Care Coordinated Applicable Imaging Reviewed Interpretation of Laboratory Data incorporated into ED treatment  Plan-home after ambulation trial if asymptomatic with that.  Follow-up with [facility] neurology regarding breakthrough seizure and consideration for  changing dose of medications.   Final Clinical Impression(s) / ED Diagnoses Final diagnoses:  Seizure [facility])    Rx / [place] Orders ED Discharge Orders    None       Daleen Bo, MD 05/16/19 1506

## 2019-05-14 NOTE — ED Notes (Signed)
Pt d/c back to group home. Alert and ambulatory at baseline. No iv. Leaving with grouphome

## 2019-05-14 NOTE — Discharge Instructions (Signed)
Follow-up with [facility] neurology regarding breakthrough seizure

## 2019-05-14 NOTE — ED Provider Notes (Signed)
19 year old male with autism and seizures presents with breakthrough seizure. Pt is at his baseline. He is ambulating in the room. Will d/c.   Bethel Born, PA-C 05/14/19 2240    Mancel Bale, MD 05/16/19 [phone]

## 2019-05-14 NOTE — ED Triage Notes (Signed)
Per ems: Pt coming from a group home c/o seizure that lasted 5 minutes. Employee stated he got stiff in the backseat of a car and lowered to the ground. No meds given. Did not hit his head. Hx of autism, nonverbal and combative at baseline     Program director # [phone] Blair Heys) will pick up pt at d/c

## 2019-05-14 NOTE — ED Notes (Signed)
Spoke with Minerva Areola and he is coming to pick up the pt. Stated he will be here "within the hour" and stated he will call the mother to give update.

## 2019-05-14 NOTE — ED Notes (Signed)
Pt ambulating around the room without assistance. Gait steady

## 2019-05-16 ENCOUNTER — Ambulatory Visit (INDEPENDENT_AMBULATORY_CARE_PROVIDER_SITE_OTHER): Payer: Medicare Other | Admitting: Family Medicine

## 2019-05-16 ENCOUNTER — Encounter: Payer: Self-pay | Admitting: Family Medicine

## 2019-05-16 ENCOUNTER — Other Ambulatory Visit: Payer: Self-pay

## 2019-05-16 VITALS — BP 118/78 | HR 92 | Ht 69.0 in | Wt 168.0 lb

## 2019-05-16 DIAGNOSIS — F840 Autistic disorder: Secondary | ICD-10-CM

## 2019-05-16 DIAGNOSIS — G40909 Epilepsy, unspecified, not intractable, without status epilepticus: Secondary | ICD-10-CM | POA: Diagnosis not present

## 2019-05-16 NOTE — Patient Instructions (Signed)
Please ensure that Nicholas Shepherd is taking lamotrigine 200mg  twice daily and oxcarbamazepine 300mg  twice daily   Follow up in 8 weeks   Seizure, Adult A seizure is a sudden burst of abnormal electrical activity in the brain. Seizures usually last from 30 seconds to 2 minutes. They can cause many different symptoms. Usually, seizures are not harmful unless they last a long time. What are the causes? Common causes of this condition include:  Fever or infection.  Conditions that affect the brain, such as: ? A brain abnormality that you were born with. ? A brain or head injury. ? Bleeding in the brain. ? A tumor. ? Stroke. ? Brain disorders such as autism or cerebral palsy.  Low blood sugar.  Conditions that are passed from parent to child (are inherited).  Problems with substances, such as: ? Having a reaction to a drug or a medicine. ? Suddenly stopping the use of a substance (withdrawal). In some cases, the cause may not be known. A person who has repeated seizures over time without a clear cause has a condition called epilepsy. What increases the risk? You are more likely to get this condition if you have:  A family history of epilepsy.  Had a seizure in the past.  A brain disorder.  A history of head injury, lack of oxygen at birth, or strokes. What are the signs or symptoms? There are many types of seizures. The symptoms vary depending on the type of seizure you have. Examples of symptoms during a seizure include:  Shaking (convulsions).  Stiffness in the body.  Passing out (losing consciousness).  Head nodding.  Staring.  Not responding to sound or touch.  Loss of bladder control and bowel control. Some people have symptoms right before and right after a seizure happens. Symptoms before a seizure may include:  Fear.  Worry (anxiety).  Feeling like you may vomit (nauseous).  Feeling like the room is spinning (vertigo).  Feeling like you saw or heard  something before (dj vu).  Odd tastes or smells.  Changes in how you see. You may see flashing lights or spots. Symptoms after a seizure happens can include:  Confusion.  Sleepiness.  Headache.  Weakness on one side of the body. How is this treated? Most seizures will stop on their own in under 5 minutes. In these cases, no treatment is needed. Seizures that last longer than 5 minutes will usually need treatment. Treatment can include:  Medicines given through an IV tube.  Avoiding things that are known to cause your seizures. These can include medicines that you take for another condition.  Medicines to treat epilepsy.  Surgery to stop the seizures. This may be needed if medicines do not help. Follow these instructions at home: Medicines  Take over-the-counter and prescription medicines only as told by your doctor.  Do not eat or drink anything that may keep your medicine from working, such as alcohol. Activity  Do not do any activities that would be dangerous if you had another seizure, like driving or swimming. Wait until your doctor says it is safe for you to do them.  If you live in the [place] ask your local DMV (department of motor vehicles) when you can drive.  Get plenty of rest. Teaching others Teach friends and family what to do when you have a seizure. They should:  Lay you on the ground.  Protect your head and body.  Loosen any tight clothing around your neck.  Turn you on your  side.  Not hold you down.  Not put anything into your mouth.  Know whether or not you need emergency care.  Stay with you until you are better.  General instructions  Contact your doctor each time you have a seizure.  Avoid anything that gives you seizures.  Keep a seizure diary. Write down: ? What you think caused each seizure. ? What you remember about each seizure.  Keep all follow-up visits as told by your doctor. This is important. Contact a doctor if:  You  have another seizure.  You have seizures more often.  There is any change in what happens during your seizures.  You keep having seizures with treatment.  You have symptoms of being sick or having an infection. Get help right away if:  You have a seizure that: ? Lasts longer than 5 minutes. ? Is different than seizures you had before. ? Makes it harder to breathe. ? Happens after you hurt your head.  You have any of these symptoms after a seizure: ? Not being able to speak. ? Not being able to use a part of your body. ? Confusion. ? A bad headache.  You have two or more seizures in a row.  You do not wake up right after a seizure.  You get hurt during a seizure. These symptoms may be an emergency. Do not wait to see if the symptoms will go away. Get medical help right away. Call your local emergency services (911 in the [place] Do not drive yourself to the hospital. Summary  Seizures usually last from 30 seconds to 2 minutes. Usually, they are not harmful unless they last a long time.  Do not eat or drink anything that may keep your medicine from working, such as alcohol.  Teach friends and family what to do when you have a seizure.  Contact your doctor each time you have a seizure. This information is not intended to replace advice given to you by your health care provider. Make sure you discuss any questions you have with your health care provider. Document Revised: 03/25/2018 Document Reviewed: 03/25/2018 Elsevier Patient Education  [place].

## 2019-05-16 NOTE — Progress Notes (Signed)
PATIENT: Nicholas Shepherd DOB: 2000-03-02  REASON FOR VISIT: follow up HISTORY FROM: patient  Chief Complaint  Patient presents with  . Follow-up    RM 2, with group home, director. Nicholas Shepherd. Had seizure on Saturday. Has questions about increasing seziure meds and his sleep.     HISTORY OF PRESENT ILLNESS: Today 05/16/19 Nicholas Shepherd is a 19 y.o. male here today for follow up. Nicholas Shepherd, Investment banker, operational, presents with him today and aids in history. Nicholas Shepherd was transferred to [facility] on 04/25/2019 for higher level of care due to behavior concerns. Last visit with Nicholas Shepherd, previous group home RN, medications were confirmed. Nicholas Shepherd was taking lamotrigine 200mg  twice daily and oxcarbamazepine 300mg  twice daily. Nicholas Shepherd reports that he has been taking lamotrigine 100mg  twice daily and oxcarbamazepine 150mg  daily. He is unsure how long medications have been given at this dose. He did have a seizure with ER eval on 4/25. He was "out with the group home" and noted to have shaking for several minutes and post ictal state following. During ER eval he was back to baseline with no further episodes. He is having difficulty with sleep patterns. He is up all night and sleeps during the day. They do have him in school but reports that he sleeps during class time. Nicholas Shepherd states that he is "up all night". Psychiatry continues to follow up closely.   HISTORY: (copied from my note on 04/25/2019)  Nicholas Shepherd is a 19 y.o. male here today for follow up for seizures. He is now taking lamotrigine 200mg  twice daily and carbamazepine 300mg  twice daily. Nicholas Shepherd, group home nurse, assists with providing history today. She reports that Jetson is doing fairly well. He is tolerating medications with no obvious adverse effects. He had a starring spell on 3/29 that lasted about 1 minute. She reports that he was semi responsive during event, no LOC, no tonic clonic movements. He is followed closely by psychiatry as well and is  currently adjusting Haldol dose from 10mg  tablets to longer acting injection and 5mg  oral dosing. He is planning to move to a different group home today. Nicholas Shepherd does mention a history of low blood sugar readings with previous seizures.    History (copied from my note on 03/23/2019)  Nicholas Shepherd a 19 y.o.malehere today for follow up for seizure. We increased lamotrigine to 150 mg twice daily mid-February following 2 prolonged seizures requiring EMS transport to the hospital. Nicholas Shepherd, group home caregiver, aids in history today. She reports that he has had 2 smaller seizures since increasing lamotrigine. Last ER visit revealed suboptimal ox carbamazepine levels. Orders were sent to increase oxcarbamazepine to 300 mg in the morning and 150 mg in the afternoon for 2 weeks then increase to 300 mg twice daily. Nicholas Shepherd states that they have just received that medication today. They will initiate increased dose today. Otherwise he is doing well. He continues to follow psychiatry closely. See reports that he anticipates changing to a different group home next week. She will relay all information including dose changes in medications with transfer.   REVIEW OF SYSTEMS: Out of a complete 14 system review of symptoms, the patient complains only of the following symptoms, seizure and all other reviewed systems are negative.  ALLERGIES: Allergies  Allergen Reactions  . Ativan [Lorazepam] Other (See Comments)    aggression  . Depakote [Divalproex Sodium] Other (See Comments)    Made patient angry and his behavioral issues worsened   . Vyvanse [  Lisdexamfetamine] Other (See Comments)    Rxn unknown per assisted living facility    HOME MEDICATIONS: Outpatient Medications Prior to Visit  Medication Sig Dispense Refill  . benztropine (COGENTIN) 1 MG tablet Take 1 mg by mouth daily.    . clomiPRAMINE (ANAFRANIL) 50 MG capsule Take 50 mg by mouth 2 (two) times daily.     . cloNIDine (CATAPRES)  0.2 MG tablet Take 0.2 mg by mouth 2 (two) times daily.   1  . haloperidol (HALDOL) 5 MG tablet Take 5 mg by mouth 2 (two) times daily.    . hydrOXYzine (ATARAX/VISTARIL) 50 MG tablet Take 50 mg by mouth 3 (three) times daily.    Marland Kitchen lamoTRIgine (LAMICTAL) 100 MG tablet Take 2 tablets (200 mg total) by mouth in the morning and at bedtime. (Patient taking differently: Take 100 mg by mouth in the morning and at bedtime. ) 120 tablet 11  . Melatonin 5 MG TABS Take 5 mg by mouth at bedtime.    . OXcarbazepine (TRILEPTAL) 150 MG tablet Take 300mg  (2 tablets) twice daily (Patient taking differently: Take 150 mg by mouth daily. ) 120 tablet 11  . QUEtiapine (SEROQUEL) 100 MG tablet Take 100 mg by mouth at bedtime.    Tosylate (INGREZZA) 80 MG CAPS Take 80 mg by mouth daily.    Marcie Bal zolpidem (AMBIEN) 10 MG tablet Take 10 mg by mouth at bedtime.     No facility-administered medications prior to visit.    PAST MEDICAL HISTORY: Past Medical History:  Diagnosis Date  . Anxiety   . Autism disorder    severe per legal gaurdian  . Depression   . Nonverbal   . Otitis media   . Seizures (HCC) 11/09/2017, 03/2018   most recent seizure activity on 04/28/18 x 4 minutes    PAST SURGICAL HISTORY: No past surgical history on file.  FAMILY HISTORY: Family History  Problem Relation Age of Onset  . Seizures Mother        epilepsy as a child    SOCIAL HISTORY: Social History   Socioeconomic History  . Marital status: Single    Spouse name: Not on file  . Number of children: 0  . Years of education: 63  . Highest education level: Not on file  Occupational History    Comment: NA  Tobacco Use  . Smoking status: Never Smoker  . Smokeless tobacco: Never Used  Substance and Sexual Activity  . Alcohol use: No  . Drug use: No  . Sexual activity: Never  Other Topics Concern  . Not on file  Social History Narrative   Lives with mother, legal guardian   08/02/2018 living at [facility] group  home   No caffeine   Social Determinants of Health   Financial Resource Strain:   . Difficulty of Paying Living Expenses:   Food Insecurity:   . Worried About [facility] in the Last Year:   . Programme researcher, broadcasting/film/video in the Last Year:   Transportation Needs:   . Barista (Medical):   Freight forwarder Lack of Transportation (Non-Medical):   Physical Activity:   . Days of Exercise per Week:   . Minutes of Exercise per Session:   Stress:   . Feeling of Stress :   Social Connections:   . Frequency of Communication with Friends and Family:   . Frequency of Social Gatherings with Friends and Family:   . Attends Religious Services:   . Active Member  of Clubs or Organizations:   . Attends Banker Meetings:   Marland Kitchen Marital Status:   Intimate Partner Violence:   . Fear of Current or Ex-Partner:   . Emotionally Abused:   Marland Kitchen Physically Abused:   . Sexually Abused:       PHYSICAL EXAM  Vitals:   05/16/19 0905  BP: 118/78  Pulse: 92  Weight: 168 lb (76.2 kg)  Height: 5\' 9"  (1.753 m)   Body mass index is 24.81 kg/m.  Generalized: Well developed, in no acute distress  Cardiology: normal rate and rhythm, no murmur noted Respiratory: clear to auscultation bilaterally  Neurological examination  Mentation: nonverbal, does grunt on occasion to questioning. Cranial nerve II-XII: unable to assess today as patient not following directions. Motor: The motor testing reveals 5 over 5 strength of all 4 extremities. Good symmetric motor tone is noted throughout.  Gait and station: Gait is normal.   DIAGNOSTIC DATA (LABS, IMAGING, TESTING) - I reviewed patient records, labs, notes, testing and imaging myself where available.  No flowsheet data found.   Lab Results  Component Value Date   WBC 14.6 (H) 05/14/2019   HGB 15.6 05/14/2019   HCT 47.2 05/14/2019   MCV 83.4 05/14/2019   PLT 290 05/14/2019      Component Value Date/Time   NA 136 05/14/2019 2111   K 4.1  05/14/2019 2111   CL 101 05/14/2019 2111   CO2 24 05/14/2019 2111   GLUCOSE 94 05/14/2019 2111   BUN 11 05/14/2019 2111   CREATININE 0.93 05/14/2019 2111   CALCIUM 9.2 05/14/2019 2111   PROT 7.4 05/14/2019 2111   ALBUMIN 4.4 05/14/2019 2111   AST 31 05/14/2019 2111   ALT 32 05/14/2019 2111   ALKPHOS 103 05/14/2019 2111   BILITOT 0.6 05/14/2019 2111   GFRNONAA >60 05/14/2019 2111   GFRAA >60 05/14/2019 2111   No results found for: CHOL, HDL, LDLCALC, LDLDIRECT, TRIG, CHOLHDL No results found for: 2112 No results found for: VITAMINB12 Lab Results  Component Value Date   TSH 1.628 01/04/2018       ASSESSMENT AND PLAN 19 y.o. year old male  has a past medical history of Anxiety, Autism disorder, Depression, Nonverbal, Otitis media, and Seizures (HCC) (11/09/2017, 03/2018). here with     ICD-10-CM   1. Seizure disorder (HCC)  G40.909   2. Autism  F84.0     Eustace has recently transferred to a new group home.  It seems as if there has been some confusion about medication regimen. [name], group [facility], reports that he has been taking lamotrigine 100 mg twice daily and ox carbamazepine 150 mg daily.  Prescriptions were written for lamotrigine 200 mg twice daily and ox carbamazepine 300 mg twice daily.  He is uncertain how long medications have been given at reported doses.  He states that [facility] given to them on transfer 3 weeks ago stated reported doses.  Dontez was seen in the ER on 4/25 for concerns of seizure.  I have reviewed correct medication doses with 5/25.  Minerva Areola also reports that medications have been adjusted through psychiatry.  Nissim continues to have more sleepiness during the day and is awake at night.  I have advised that they keep close follow-up with psychiatry.  I have asked [name] to call to confirm correct medication list once returning to the group home.  We will consider referral to epilepsy specialist should seizures concern despite correct dosing.  He will follow up  with  me in 8 weeks, sooner if needed.  Eric verbalizes understanding with this plan.   No orders of the defined types were placed in this encounter.    No orders of the defined types were placed in this encounter.     I spent 15 minutes with the patient. 50% of this time was spent counseling and educating patient on plan of care and medications.    Shawnie Dapper, FNP-C 05/16/2019, 11:03 AM [facility] [address], [address] [place], [place] [address] [phone]

## 2019-05-30 ENCOUNTER — Telehealth: Payer: Medicare Other | Admitting: Family Medicine

## 2019-05-30 ENCOUNTER — Ambulatory Visit: Payer: Medicare Other | Attending: Family

## 2019-05-30 DIAGNOSIS — Z23 Encounter for immunization: Secondary | ICD-10-CM

## 2019-05-30 NOTE — Progress Notes (Signed)
   Covid-19 Vaccination Clinic  Name:  Nicholas Shepherd    MRN: [id number] DOB: 12/07/2000  05/30/2019  [name] was observed post Covid-19 immunization for 15 minutes without incident. He was provided with Vaccine Information Sheet and instruction to access the V-Safe system.   [name] was instructed to call 911 with any severe reactions post vaccine: Marland Kitchen Difficulty breathing  . Swelling of face and throat  . A fast heartbeat  . A bad rash all over body  . Dizziness and weakness   Immunizations Administered    Name Date Dose VIS Date Route   Moderna COVID-19 Vaccine 05/30/2019 10:02 AM 0.5 mL 12/2018 Intramuscular   Manufacturer: Moderna   Lot: [id number]   NDC: 85631-497-02

## 2019-06-22 ENCOUNTER — Telehealth: Payer: Self-pay | Admitting: *Deleted

## 2019-06-22 NOTE — Progress Notes (Signed)
I reviewed note and agree with plan.   Desarie Feild R. Manika Hast, MD 06/22/2019, 4:12 PM Certified in Neurology, Neurophysiology and Neuroimaging  [facility] [address], [address] [place], [place] [address] [phone]  

## 2019-06-22 NOTE — Telephone Encounter (Signed)
I called Traci then ERIC program manager at group home [phone] and relayed message, confirmed medications as listed 200mg  po bid lamotrigine and oxcarbamazepine 300mg  po bid.  Will get trough level on Monday.

## 2019-06-22 NOTE — Telephone Encounter (Signed)
When he was in last time, the caregiver reported he was taking lamotrigine 100mg  twice daily and oxcarbamazepine 150mg  daily. Can you please confirm that medication dosage was increased to lamotrigine 200mg  twice daily and oxcarbamazipine 300mg  twice daily. If he hasn't had labs recently I would like to get a chemistry and AED levels. I can place order in Epic if needed. TY.

## 2019-06-22 NOTE — Telephone Encounter (Signed)
receivede call from Traci with pts group home (since 03/2019).  He has had 2 seizures, one at school and one at home (less then 5 minutes).  She had question if needed to let us know every time he has a seizure.  I relayed if not an usual thing for pt then would call, sometimes needing to get labs/appt.  He has been ok other then some behavior issues.  Has appt 07-19-19 to see you.  Has been compliant in taking medications.  No triggers that she has noted.  FYI

## 2019-06-26 ENCOUNTER — Other Ambulatory Visit (INDEPENDENT_AMBULATORY_CARE_PROVIDER_SITE_OTHER): Payer: Self-pay

## 2019-06-26 DIAGNOSIS — G40909 Epilepsy, unspecified, not intractable, without status epilepticus: Secondary | ICD-10-CM

## 2019-06-26 DIAGNOSIS — Z0289 Encounter for other administrative examinations: Secondary | ICD-10-CM

## 2019-06-29 ENCOUNTER — Other Ambulatory Visit: Payer: Self-pay | Admitting: Family Medicine

## 2019-06-29 ENCOUNTER — Telehealth: Payer: Self-pay | Admitting: *Deleted

## 2019-06-29 LAB — 10-HYDROXYCARBAZEPINE: Oxcarbazepine SerPl-Mcnc: 8 ug/mL — ABNORMAL LOW (ref 10–35)

## 2019-06-29 LAB — COMPREHENSIVE METABOLIC PANEL
ALT: 23 IU/L (ref 0–44)
AST: 24 IU/L (ref 0–40)
Albumin/Globulin Ratio: 2.2 (ref 1.2–2.2)
Albumin: 5 g/dL (ref 4.1–5.2)
Alkaline Phosphatase: 133 IU/L — ABNORMAL HIGH (ref 55–125)
BUN/Creatinine Ratio: 14 (ref 9–20)
BUN: 14 mg/dL (ref 6–20)
Bilirubin Total: 0.2 mg/dL (ref 0.0–1.2)
CO2: 25 mmol/L (ref 20–29)
Calcium: 9.7 mg/dL (ref 8.7–10.2)
Chloride: 98 mmol/L (ref 96–106)
Creatinine, Ser: 1.02 mg/dL (ref 0.76–1.27)
GFR calc Af Amer: 122 mL/min/{1.73_m2} (ref >59)
GFR calc non Af Amer: 105 mL/min/{1.73_m2} (ref >59)
Globulin, Total: 2.3 g/dL (ref 1.5–4.5)
Glucose: 88 mg/dL (ref 65–99)
Potassium: 4.5 mmol/L (ref 3.5–5.2)
Sodium: 138 mmol/L (ref 134–144)
Total Protein: 7.3 g/dL (ref 6.0–8.5)

## 2019-06-29 LAB — LAMOTRIGINE LEVEL: Lamotrigine Lvl: 6.2 ug/mL (ref 2.0–20.0)

## 2019-06-29 MED ORDER — OXCARBAZEPINE 150 MG PO TABS: ORAL_TABLET | ORAL | 11 refills | Status: AC

## 2019-06-29 NOTE — Telephone Encounter (Signed)
I called and spoke to [employer], gave him the lab results and recommendations to take oxcarbamazepine 300mg  po TID.  Repeat labs (chem and oxcarb) in 6-8-wks.  I will email results to ERIC at [email or web address].  He mentioned also that pt will be going to live in [place] at [facility].

## 2019-06-29 NOTE — Telephone Encounter (Signed)
-----   Message from Shawnie Dapper, NP sent at 06/29/2019  9:32 AM EDT ----- Please let French Ana know that labs are ok. Oxcarbamazepine levels are still a little low. I would like to increase dose from 300mg  twice daily to 300mg  three times daily. Can you please let them know that I have updated rx and we should repeat labs (chem and oxcarb level) in 6-8 weeks. TY.

## 2019-07-18 NOTE — Progress Notes (Deleted)
PATIENT: Nicholas Shepherd DOB: 26-Jan-2000  REASON FOR VISIT: follow up HISTORY FROM: patient  No chief complaint on file.    HISTORY OF PRESENT ILLNESS: Today 07/18/19 Nicholas Shepherd is a 19 y.o. male here today for follow up for seizure. He continues lamotrigine 200mg  BID and oxcarbamazepine 300mg  TID (recently increased from 300mg  BID following low AED level). [facility] in [place], .   HISTORY: (copied from my note on 05/16/2019)  COLEMAN KALAS is a 19 y.o. male here today for follow up. 05/18/2019, Nicholas Shepherd, presents with him today and aids in history. [name] was transferred to [facility] on 04/25/2019 for higher level of care due to behavior concerns. Last visit with Kandis Shepherd, previous group home RN, medications were confirmed. Brenda was taking lamotrigine 200mg  twice daily and oxcarbamazepine 300mg  twice daily. 06/25/2019 reports that he has been taking lamotrigine 100mg  twice daily and oxcarbamazepine 150mg  daily. He is unsure how long medications have been given at this dose. He did have a seizure with ER eval on 4/25. He was "out with the group home" and noted to have shaking for several minutes and post ictal state following. During ER eval he was back to baseline with no further episodes. He is having difficulty with sleep patterns. He is up all night and sleeps during the day. They do have him in school but reports that he sleeps during class time. Kandis Shepherd states that he is "up all night". Psychiatry continues to follow up closely.   HISTORY: (copied from my note on 04/25/2019)  Nicholas I Mongeauis a 18 y.o.malehere today for follow up for seizures. He is now taking lamotrigine 200mg  twice daily and carbamazepine 300mg  twice daily.Sarah, group home nurse, assists with providing history today. She reports that Teigan is doing fairly well. He is tolerating medications with no obvious adverse effects. He had a starring spell on 3/29 that lasted about 1 minute. She reports  that he was semi responsive during event, no LOC, no tonic clonic movements. He is followed closely by psychiatry as well and is currently adjusting Haldol dose from 10mg  tablets to longer acting injection and 5mg  oral dosing. He is planning to move to a different group home today. does mention a history of low blood sugar readings with previous seizures.   History (copied from my note on 03/23/2019)  Nicholas I Mongeauis a 18 y.o.malehere today for follow up for seizure. We increased lamotrigine to 150 mg twice daily mid-February following 2 prolonged seizures requiring EMS transport to the hospital. 5/25, group home caregiver, aids in history today. She reports that he has had 2 smaller seizures since increasing lamotrigine. Last ER visit revealed suboptimal ox carbamazepine levels. Orders were sent to increase oxcarbamazepine to 300 mg in the morning and 150 mg in the afternoon for 2 weeks then increase to 300 mg twice daily. Minerva Areola states that they have just received that medication today. They will initiate increased dose today. Otherwise he is doing well. He continues to follow psychiatry closely. See reports that he anticipates changing to a different group home next week. She will relay all information including dose changes in medications with transfer.    REVIEW OF SYSTEMS: Out of a complete 14 system review of symptoms, the patient complains only of the following symptoms, and all other reviewed systems are negative.  ALLERGIES: Allergies  Allergen Reactions   Ativan [Lorazepam] Other (See Comments)    aggression   Depakote [Divalproex Sodium] Other (See Comments)  Made patient angry and his behavioral issues worsened    Vyvanse [Lisdexamfetamine] Other (See Comments)    Rxn unknown per assisted living facility    HOME MEDICATIONS: Outpatient Medications Prior to Visit  Medication Sig Dispense Refill   benztropine (COGENTIN) 1 MG tablet Take 1 mg by mouth  daily.     clomiPRAMINE (ANAFRANIL) 50 MG capsule Take 50 mg by mouth 2 (two) times daily.      cloNIDine (CATAPRES) 0.2 MG tablet Take 0.2 mg by mouth 2 (two) times daily.   1   haloperidol (HALDOL) 5 MG tablet Take 5 mg by mouth 2 (two) times daily.     hydrOXYzine (ATARAX/VISTARIL) 50 MG tablet Take 50 mg by mouth 3 (three) times daily.     lamoTRIgine (LAMICTAL) 100 MG tablet Take 2 tablets (200 mg total) by mouth in the morning and at bedtime. (Patient taking differently: Take 100 mg by mouth in the morning and at bedtime. ) 120 tablet 11   Melatonin 5 MG TABS Take 5 mg by mouth at bedtime.     OXcarbazepine (TRILEPTAL) 150 MG tablet Take 300mg  (2 tablets) three times daily 180 tablet 11   QUEtiapine (SEROQUEL) 100 MG tablet Take 100 mg by mouth at bedtime.     Valbenazine Tosylate (INGREZZA) 80 MG CAPS Take 80 mg by mouth daily.     zolpidem (AMBIEN) 10 MG tablet Take 10 mg by mouth at bedtime.     No facility-administered medications prior to visit.    PAST MEDICAL HISTORY: Past Medical History:  Diagnosis Date   Anxiety    Autism disorder    severe per legal gaurdian   Depression    Nonverbal    Otitis media    Seizures (HCC) 11/09/2017, 03/2018   most recent seizure activity on 04/28/18 x 4 minutes    PAST SURGICAL HISTORY: No past surgical history on file.  FAMILY HISTORY: Family History  Problem Relation Age of Onset   Seizures Mother        epilepsy as a child    SOCIAL HISTORY: Social History   Socioeconomic History   Marital status: Single    Spouse name: Not on file   Number of children: 0   Years of education: 12   Highest education level: Not on file  Occupational History    Comment: NA  Tobacco Use   Smoking status: Never Smoker   Smokeless tobacco: Never Used  Vaping Use   Vaping Use: Never used  Substance and Sexual Activity   Alcohol use: No   Drug use: No   Sexual activity: Never  Other Topics Concern   Not on  file  Social History Narrative   Lives with mother, legal guardian   08/02/2018 living at [facility] group home   No caffeine   Social Determinants of Health   Financial Resource Strain:    Difficulty of Paying Living Expenses:   Food Insecurity:    Worried About [facility] in the Last Year:    Programme researcher, broadcasting/film/video in the Last Year:   Transportation Needs:    Barista (Medical):    Lack of Transportation (Non-Medical):   Physical Activity:    Days of Exercise per Week:    Minutes of Exercise per Session:   Stress:    Feeling of Stress :   Social Connections:    Frequency of Communication with Friends and Family:    Frequency of Social Gatherings with Friends and  Family:    Attends Religious Services:    Active Member of Clubs or Organizations:    Attends Engineer, structural:    Marital Status:   Intimate Partner Violence:    Fear of Current or Ex-Partner:    Emotionally Abused:    Physically Abused:    Sexually Abused:       PHYSICAL EXAM  There were no vitals filed for this visit. There is no height or weight on file to calculate BMI.  Generalized: Well developed, in no acute distress  Cardiology: normal rate and rhythm, no murmur noted Respiratory: clear to auscultation bilaterally  Neurological examination  Mentation: Alert oriented to time, place, history taking. Follows all commands speech and language fluent Cranial nerve II-XII: Pupils were equal round reactive to light. Extraocular movements were full, visual field were full on confrontational test. Facial sensation and strength were normal. Uvula tongue midline. Head turning and shoulder shrug  were normal and symmetric. Motor: The motor testing reveals 5 over 5 strength of all 4 extremities. Good symmetric motor tone is noted throughout.  Sensory: Sensory testing is intact to soft touch on all 4 extremities. No evidence of extinction is noted.  Coordination:  Cerebellar testing reveals good finger-nose-finger and heel-to-shin bilaterally.  Gait and station: Gait is normal. Tandem gait is normal. Romberg is negative. No drift is seen.  Reflexes: Deep tendon reflexes are symmetric and normal bilaterally.   DIAGNOSTIC DATA (LABS, IMAGING, TESTING) - I reviewed patient records, labs, notes, testing and imaging myself where available.  No flowsheet data found.   Lab Results  Component Value Date   WBC 14.6 (H) 05/14/2019   HGB 15.6 05/14/2019   HCT 47.2 05/14/2019   MCV 83.4 05/14/2019   PLT 290 05/14/2019      Component Value Date/Time   NA 138 06/26/2019 0827   K 4.5 06/26/2019 0827   CL 98 06/26/2019 0827   CO2 25 06/26/2019 0827   GLUCOSE 88 06/26/2019 0827   GLUCOSE 94 05/14/2019 2111   BUN 14 06/26/2019 0827   CREATININE 1.02 06/26/2019 0827   CALCIUM 9.7 06/26/2019 0827   PROT 7.3 06/26/2019 0827   ALBUMIN 5.0 06/26/2019 0827   AST 24 06/26/2019 0827   ALT 23 06/26/2019 0827   ALKPHOS 133 (H) 06/26/2019 0827   BILITOT 0.2 06/26/2019 0827   GFRNONAA 105 06/26/2019 0827   GFRAA 122 06/26/2019 0827   No results found for: CHOL, HDL, LDLCALC, LDLDIRECT, TRIG, CHOLHDL No results found for: XAJO8N No results found for: VITAMINB12 Lab Results  Component Value Date   TSH 1.628 01/04/2018       ASSESSMENT AND PLAN 19 y.o. year old male  has a past medical history of Anxiety, Autism disorder, Depression, Nonverbal, Otitis media, and Seizures (HCC) (11/09/2017, 03/2018). here with ***    ICD-10-CM   1. Seizure disorder (HCC)  G40.909   2. Autism  F84.0        No orders of the defined types were placed in this encounter.    No orders of the defined types were placed in this encounter.     I spent 15 minutes with the patient. 50% of this time was spent counseling and educating patient on plan of care and medications.    Shawnie Dapper, FNP-C 07/18/2019, 4:25 PM [facility] [address] [place], [place] [address] [phone]

## 2019-07-19 ENCOUNTER — Ambulatory Visit: Payer: Medicare Other | Admitting: Family Medicine

## 2020-03-30 IMAGING — CT CT HEAD W/O CM
4 series · 15 of 47 positions shown, 17 images · non-contrast
Comparison: None.

CLINICAL DATA: Head trauma, headache.

EXAM:
CT HEAD WITHOUT CONTRAST
CT CERVICAL SPINE WITHOUT CONTRAST
TECHNIQUE: Multidetector CT imaging of the head and cervical spine was
performed following the standard protocol without intravenous
contrast. Multiplanar CT image reconstructions of the cervical spine
were also generated.

[Series 3: head wo · axial · 0.47mm/px · z∈[+1522,+1642]mm · 7 of 33 slices shown, 9 images]
[im 5/33  brain]
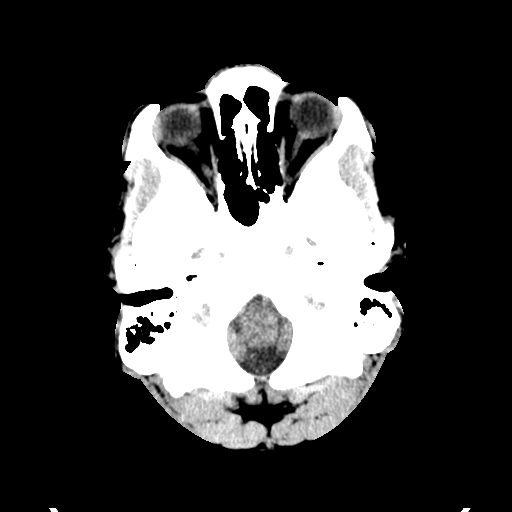
[im 5/33  bone]
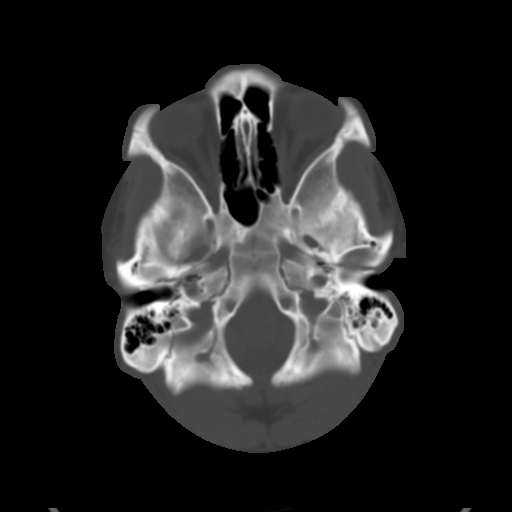
[im 9/33  brain]
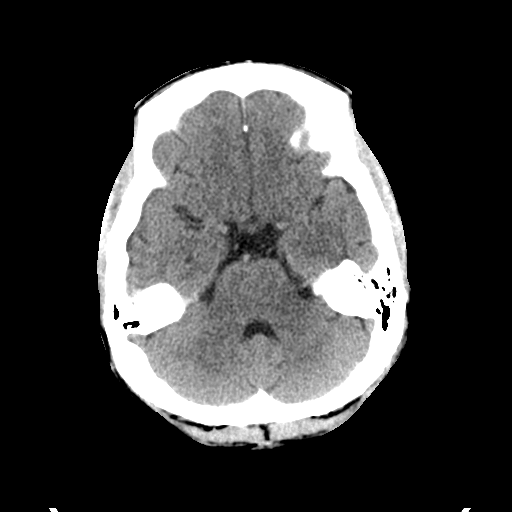
[im 13/33  brain]
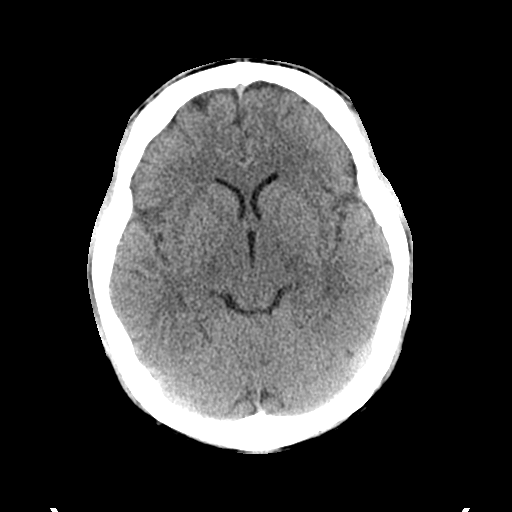
[im 17/33  brain]
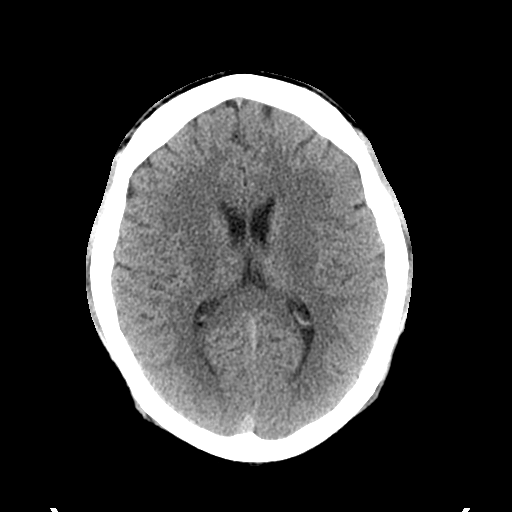
[im 21/33  brain]
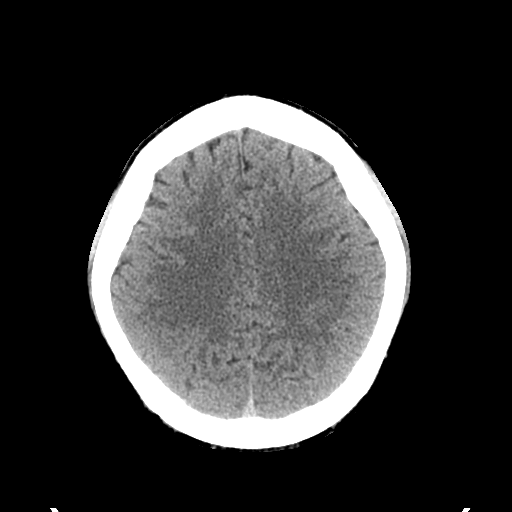
[im 21/33  bone]
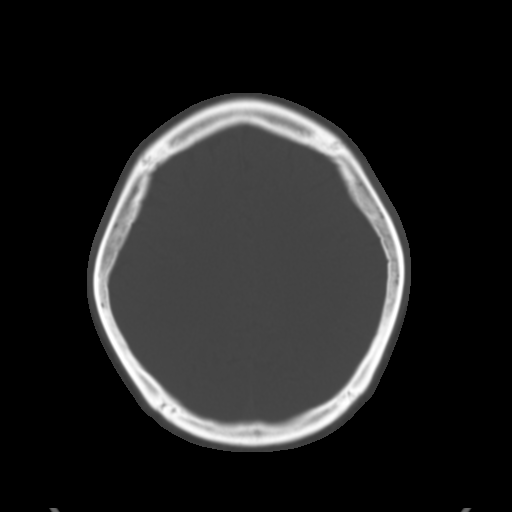
[im 25/33  brain]
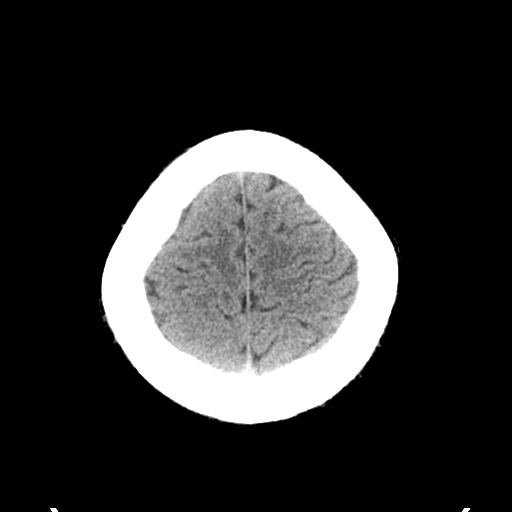
[im 29/33  brain]
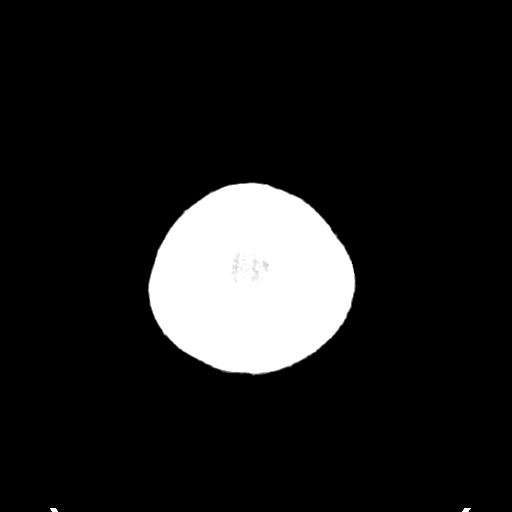

[Series 4: head bone · axial · 0.47mm/px · z∈[+1518,+1534]mm · 2 of 83 slices shown]
[im 9/83  bone]
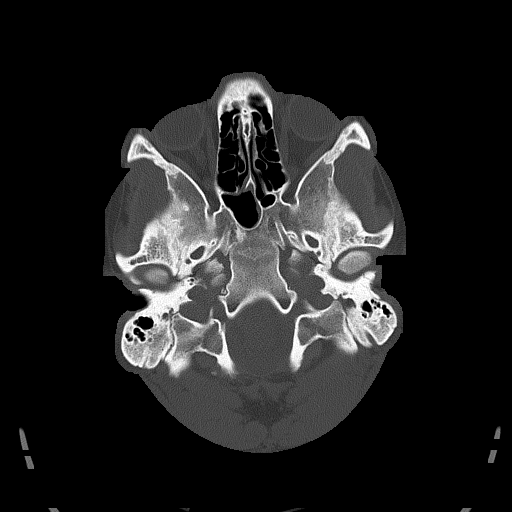
[im 17/83  bone]
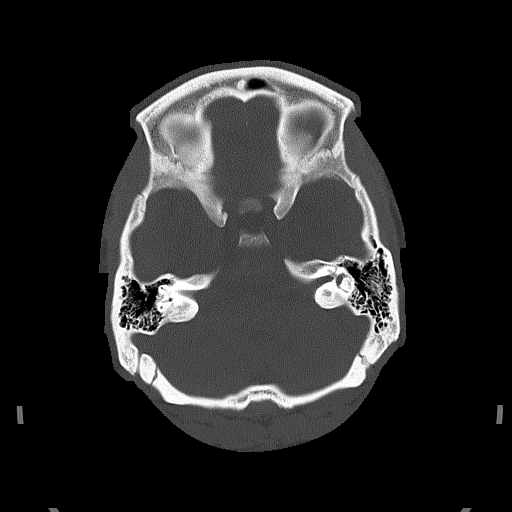

[Series 5: coronal soft tissue · coronal · 0.33mm/px · 3 of 65 slices shown]
[im 29/65  brain]
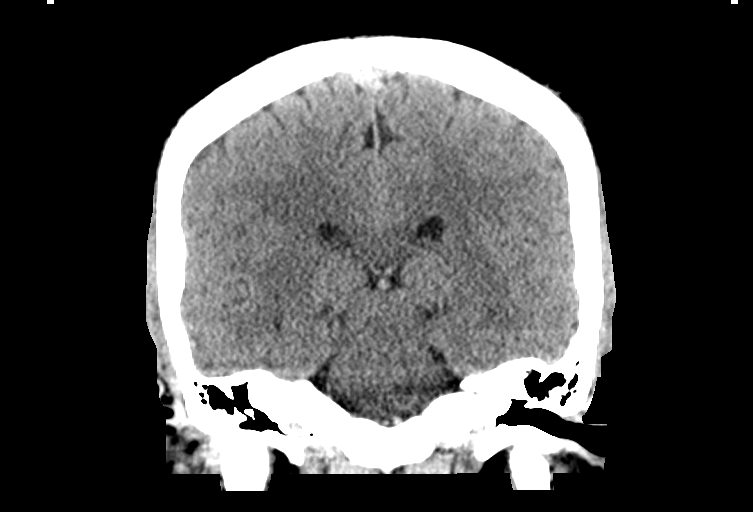
[im 36/65  brain]
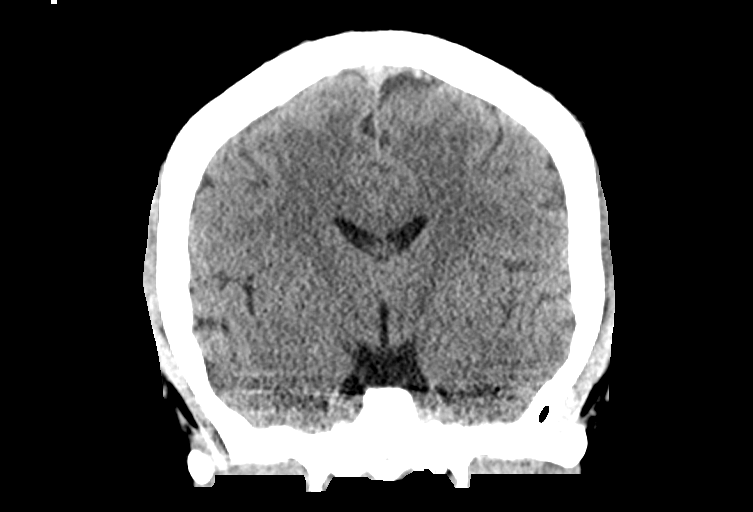
[im 43/65  brain]
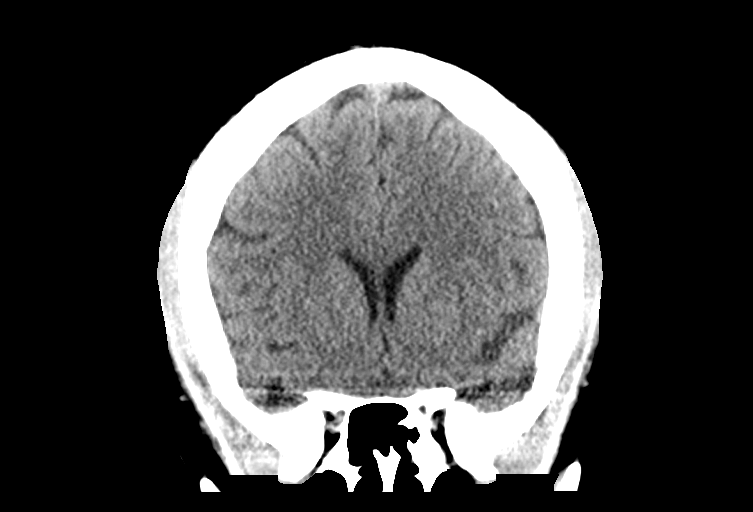

[Series 6: sagittal soft tissue · sagittal · 0.33mm/px · 3 of 53 slices shown]
[im 18/53  brain]
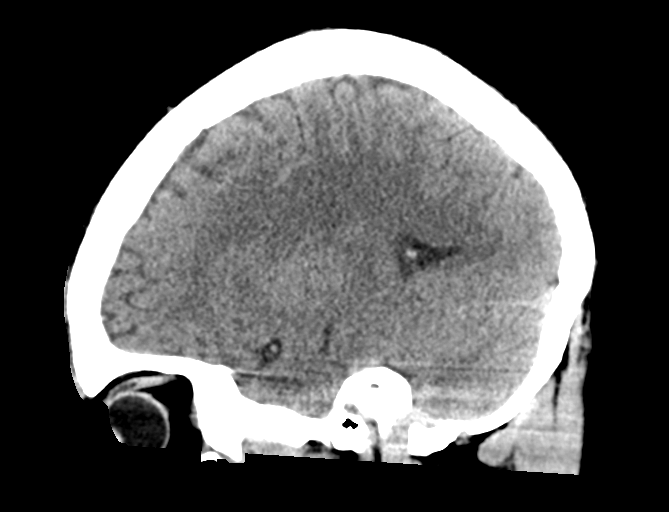
[im 27/53  brain]
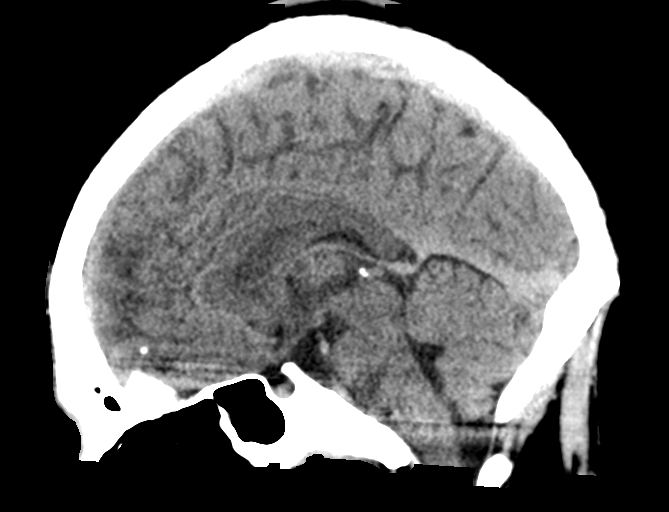
[im 35/53  brain]
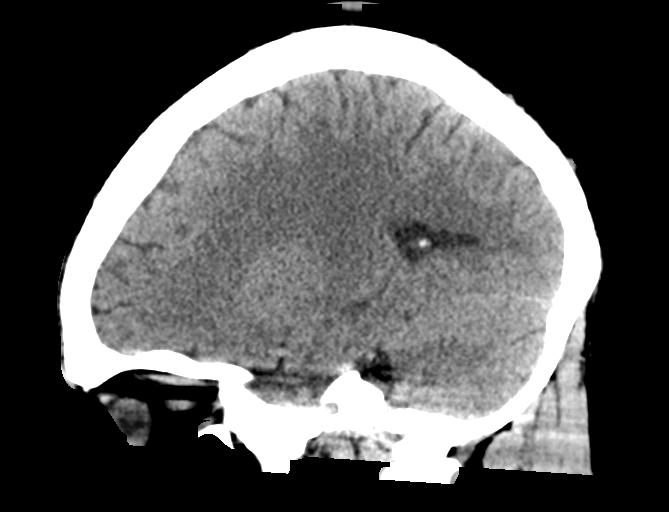

[15 of 47 positions shown; findings below may reference images not displayed]

FINDINGS: CT HEAD FINDINGS

Brain: No evidence of acute infarction, hemorrhage, hydrocephalus,
extra-axial collection or mass lesion/mass effect.

Vascular: No hyperdense vessel or unexpected calcification.

Skull: Normal. Negative for fracture or focal lesion.

Sinuses/Orbits: No acute finding.

Other: None.

CT CERVICAL SPINE FINDINGS

Alignment: Normal.

Skull base and vertebrae: No acute fracture. No primary bone lesion
or focal pathologic process.

Soft tissues and spinal canal: No prevertebral fluid or swelling. No
visible canal hematoma.

Disc levels:  No significant degenerative change

Upper chest: Negative.

Other: None.
IMPRESSION: 1. No CT evidence of acute intracranial process.
2. No fracture or malalignment of the cervical spine.
